# Patient Record
Sex: Female | Born: 1960 | Race: White | Hispanic: No | Smoking: Never smoker
Health system: Southern US, Community
[De-identification: ages and names within clinical notes are randomized; demographics above are authoritative.]

## PROBLEM LIST (undated history)

## (undated) DIAGNOSIS — J302 Other seasonal allergic rhinitis: Secondary | ICD-10-CM

## (undated) DIAGNOSIS — F419 Anxiety disorder, unspecified: Secondary | ICD-10-CM

## (undated) DIAGNOSIS — G43909 Migraine, unspecified, not intractable, without status migrainosus: Secondary | ICD-10-CM

## (undated) DIAGNOSIS — R112 Nausea with vomiting, unspecified: Secondary | ICD-10-CM

## (undated) DIAGNOSIS — Z9889 Other specified postprocedural states: Secondary | ICD-10-CM

## (undated) DIAGNOSIS — E079 Disorder of thyroid, unspecified: Secondary | ICD-10-CM

## (undated) DIAGNOSIS — E039 Hypothyroidism, unspecified: Secondary | ICD-10-CM

## (undated) HISTORY — PX: SPINE SURGERY: SHX786

## (undated) HISTORY — PX: CHOLECYSTECTOMY: SHX55

## (undated) HISTORY — DX: Disorder of thyroid, unspecified: E07.9

## (undated) HISTORY — PX: NASAL SINUS SURGERY: SHX719

## (undated) HISTORY — DX: Migraine, unspecified, not intractable, without status migrainosus: G43.909

---

## 1993-10-21 HISTORY — PX: ABDOMINAL HYSTERECTOMY: SHX81

## 2004-07-09 ENCOUNTER — Encounter: Payer: Self-pay | Admitting: Podiatry

## 2005-07-23 ENCOUNTER — Ambulatory Visit: Payer: Self-pay | Admitting: Surgery

## 2005-07-28 ENCOUNTER — Ambulatory Visit: Payer: Self-pay | Admitting: Surgery

## 2005-12-31 ENCOUNTER — Ambulatory Visit: Payer: Self-pay | Admitting: Unknown Physician Specialty

## 2006-01-02 ENCOUNTER — Ambulatory Visit: Payer: Self-pay | Admitting: Unknown Physician Specialty

## 2006-01-14 ENCOUNTER — Ambulatory Visit: Payer: Self-pay | Admitting: Unknown Physician Specialty

## 2006-01-17 ENCOUNTER — Ambulatory Visit: Payer: Self-pay | Admitting: Orthopaedic Surgery

## 2006-01-21 ENCOUNTER — Ambulatory Visit: Payer: Self-pay | Admitting: Otolaryngology

## 2006-02-19 ENCOUNTER — Ambulatory Visit: Payer: Self-pay | Admitting: Unknown Physician Specialty

## 2006-06-23 HISTORY — PX: SHOULDER ARTHROSCOPY W/ CAPSULAR REPAIR: SHX2398

## 2007-01-22 ENCOUNTER — Ambulatory Visit: Payer: Self-pay | Admitting: Unknown Physician Specialty

## 2007-04-14 ENCOUNTER — Ambulatory Visit: Payer: Self-pay | Admitting: Unknown Physician Specialty

## 2007-06-30 ENCOUNTER — Ambulatory Visit: Payer: Self-pay | Admitting: Gastroenterology

## 2008-03-02 ENCOUNTER — Ambulatory Visit: Payer: Self-pay | Admitting: Unknown Physician Specialty

## 2008-03-26 ENCOUNTER — Ambulatory Visit: Payer: Self-pay | Admitting: Family Medicine

## 2008-04-17 ENCOUNTER — Ambulatory Visit: Payer: Self-pay | Admitting: Unknown Physician Specialty

## 2008-06-06 ENCOUNTER — Ambulatory Visit: Payer: Self-pay | Admitting: Anesthesiology

## 2008-07-07 ENCOUNTER — Ambulatory Visit: Payer: Self-pay | Admitting: Anesthesiology

## 2008-09-01 ENCOUNTER — Ambulatory Visit: Payer: Self-pay | Admitting: Family Medicine

## 2008-11-20 ENCOUNTER — Ambulatory Visit: Payer: Self-pay | Admitting: Internal Medicine

## 2009-01-15 ENCOUNTER — Ambulatory Visit: Payer: Self-pay

## 2009-01-21 ENCOUNTER — Ambulatory Visit: Payer: Self-pay | Admitting: Internal Medicine

## 2009-04-10 ENCOUNTER — Ambulatory Visit: Payer: Self-pay | Admitting: Unknown Physician Specialty

## 2009-04-26 ENCOUNTER — Ambulatory Visit: Payer: Self-pay | Admitting: Unknown Physician Specialty

## 2009-05-02 ENCOUNTER — Encounter: Payer: Self-pay | Admitting: Unknown Physician Specialty

## 2009-05-23 ENCOUNTER — Encounter: Payer: Self-pay | Admitting: Unknown Physician Specialty

## 2009-05-30 ENCOUNTER — Ambulatory Visit: Payer: Self-pay | Admitting: Unknown Physician Specialty

## 2009-06-20 ENCOUNTER — Ambulatory Visit: Payer: Self-pay | Admitting: Specialist

## 2009-07-03 ENCOUNTER — Ambulatory Visit: Payer: Self-pay | Admitting: Internal Medicine

## 2009-07-04 ENCOUNTER — Encounter: Payer: Self-pay | Admitting: Specialist

## 2009-07-24 ENCOUNTER — Encounter: Payer: Self-pay | Admitting: Specialist

## 2009-07-31 LAB — HM COLONOSCOPY: HM Colonoscopy: NORMAL

## 2009-10-14 ENCOUNTER — Ambulatory Visit: Payer: Self-pay | Admitting: Family Medicine

## 2010-01-15 ENCOUNTER — Ambulatory Visit: Payer: Self-pay | Admitting: Internal Medicine

## 2010-04-02 ENCOUNTER — Ambulatory Visit: Payer: Self-pay

## 2011-01-20 ENCOUNTER — Ambulatory Visit: Payer: Self-pay | Admitting: Unknown Physician Specialty

## 2011-01-21 LAB — PATHOLOGY REPORT

## 2011-05-07 ENCOUNTER — Ambulatory Visit: Payer: Self-pay

## 2011-05-26 ENCOUNTER — Ambulatory Visit: Payer: Self-pay | Admitting: Unknown Physician Specialty

## 2011-05-26 LAB — HM MAMMOGRAPHY: HM Mammogram: NORMAL

## 2011-06-28 LAB — HM MAMMOGRAPHY: HM Mammogram: NORMAL

## 2011-07-24 ENCOUNTER — Ambulatory Visit: Payer: Self-pay | Admitting: Internal Medicine

## 2011-07-25 ENCOUNTER — Ambulatory Visit: Payer: Self-pay | Admitting: Internal Medicine

## 2011-07-28 ENCOUNTER — Ambulatory Visit: Payer: Self-pay | Admitting: Internal Medicine

## 2011-09-01 ENCOUNTER — Ambulatory Visit (INDEPENDENT_AMBULATORY_CARE_PROVIDER_SITE_OTHER): Payer: PRIVATE HEALTH INSURANCE | Admitting: Internal Medicine

## 2011-09-01 ENCOUNTER — Encounter: Payer: Self-pay | Admitting: Internal Medicine

## 2011-09-01 DIAGNOSIS — Z780 Asymptomatic menopausal state: Secondary | ICD-10-CM

## 2011-09-01 DIAGNOSIS — M5106 Intervertebral disc disorders with myelopathy, lumbar region: Secondary | ICD-10-CM | POA: Insufficient documentation

## 2011-09-01 DIAGNOSIS — E669 Obesity, unspecified: Secondary | ICD-10-CM | POA: Insufficient documentation

## 2011-09-01 DIAGNOSIS — K219 Gastro-esophageal reflux disease without esophagitis: Secondary | ICD-10-CM

## 2011-09-01 DIAGNOSIS — N951 Menopausal and female climacteric states: Secondary | ICD-10-CM

## 2011-09-01 DIAGNOSIS — E039 Hypothyroidism, unspecified: Secondary | ICD-10-CM | POA: Insufficient documentation

## 2011-09-01 DIAGNOSIS — G43909 Migraine, unspecified, not intractable, without status migrainosus: Secondary | ICD-10-CM

## 2011-09-01 MED ORDER — VENLAFAXINE HCL ER 75 MG PO CP24: 75.0000 mg | ORAL_CAPSULE | Freq: Every day | ORAL | Status: DC

## 2011-09-01 MED ORDER — RIZATRIPTAN BENZOATE 10 MG PO TABS: 10.0000 mg | ORAL_TABLET | ORAL | Status: DC | PRN

## 2011-09-01 MED ORDER — PANTOPRAZOLE SODIUM 40 MG PO TBEC: 40.0000 mg | DELAYED_RELEASE_TABLET | Freq: Two times a day (BID) | ORAL | Status: DC

## 2011-09-01 NOTE — Assessment & Plan Note (Signed)
Managed with Synthroid. TSH due.

## 2011-09-01 NOTE — Progress Notes (Signed)
  Subjective:    Patient ID: Natasha Pitts, female    DOB: October 01, 1960, 51 y.o.   MRN: [id number]  HPI  ICU RN presents for primary care,.  History of L4 laminectomy and S1 discectomy 2 weeks ago by [facility] in [place] for symptoms of back pain  l5 laminectomy last year by [facility] .  Still numbn in the S1 distribution (back of calf down achilles) .  On short term disability till mid April.    Past Medical History  Diagnosis Date  . Thyroid disease   . Migraines     improved with menopasue   No current outpatient prescriptions on file prior to visit.   Review of Systems  Constitutional: Negative for fever, chills and unexpected weight change.  HENT: Negative for hearing loss, ear pain, nosebleeds, congestion, sore throat, facial swelling, rhinorrhea, sneezing, mouth sores, trouble swallowing, neck pain, neck stiffness, voice change, postnasal drip, sinus pressure, tinnitus and ear discharge.   Eyes: Negative for pain, discharge, redness and visual disturbance.  Respiratory: Negative for cough, chest tightness, shortness of breath, wheezing and stridor.   Cardiovascular: Negative for chest pain, palpitations and leg swelling.  Musculoskeletal: Negative for myalgias and arthralgias.  Skin: Negative for color change and rash.  Neurological: Negative for dizziness, weakness, light-headedness and headaches.  Hematological: Negative for adenopathy.       Objective:   Physical Exam  Constitutional: She is oriented to person, place, and time. She appears well-developed and well-nourished.  HENT:  Mouth/Throat: Oropharynx is clear and moist.  Eyes: EOM are normal. Pupils are equal, round, and reactive to light. No scleral icterus.  Neck: Normal range of motion. Neck supple. No JVD present. No thyromegaly present.  Cardiovascular: Normal rate, regular rhythm, normal heart sounds and intact distal pulses.   Pulmonary/Chest: Effort normal and breath sounds normal.  Abdominal: Soft. Bowel sounds are  normal. She exhibits no mass. There is no tenderness.  Musculoskeletal: Normal range of motion. She exhibits no edema.  Lymphadenopathy:    She has no cervical adenopathy.  Neurological: She is alert and oriented to person, place, and time.  Skin: Skin is warm and dry.  Psychiatric: She has a normal mood and affect.          Assessment & Plan:

## 2011-09-01 NOTE — Assessment & Plan Note (Signed)
With recent lumbar spine surgery, she is not a candidate for exercise currently.  I have addressed  BMI and recommended a low glycemic index diet utilitzign smaller more frequent meals to aid his metabolism.  I have alse recommended that he start exercisign with a gaol of 30 minutes of aerovic exercise a minimum of 5 days per week.

## 2011-09-01 NOTE — Assessment & Plan Note (Signed)
S/p l4 and S1 laminectomy/decompression several weeks ago with excellent results.

## 2011-12-01 ENCOUNTER — Encounter: Payer: PRIVATE HEALTH INSURANCE | Admitting: Internal Medicine

## 2011-12-10 ENCOUNTER — Ambulatory Visit: Payer: Self-pay | Admitting: Bariatrics

## 2011-12-16 ENCOUNTER — Other Ambulatory Visit: Payer: Self-pay | Admitting: Internal Medicine

## 2011-12-22 ENCOUNTER — Ambulatory Visit: Payer: Self-pay | Admitting: Bariatrics

## 2012-03-24 ENCOUNTER — Ambulatory Visit (INDEPENDENT_AMBULATORY_CARE_PROVIDER_SITE_OTHER): Payer: PRIVATE HEALTH INSURANCE | Admitting: Internal Medicine

## 2012-03-24 ENCOUNTER — Encounter: Payer: Self-pay | Admitting: Internal Medicine

## 2012-03-24 VITALS — BP 132/80 | HR 73 | Temp 98.2°F | Ht 59.0 in | Wt 179.2 lb

## 2012-03-24 DIAGNOSIS — M79641 Pain in right hand: Secondary | ICD-10-CM

## 2012-03-24 DIAGNOSIS — E669 Obesity, unspecified: Secondary | ICD-10-CM

## 2012-03-24 DIAGNOSIS — M79609 Pain in unspecified limb: Secondary | ICD-10-CM

## 2012-03-24 DIAGNOSIS — M5106 Intervertebral disc disorders with myelopathy, lumbar region: Secondary | ICD-10-CM

## 2012-03-24 MED ORDER — OXYCODONE-ACETAMINOPHEN 5-325 MG PO TABS: 1.0000 | ORAL_TABLET | Freq: Four times a day (QID) | ORAL | Status: DC | PRN

## 2012-03-24 MED ORDER — RIZATRIPTAN BENZOATE 10 MG PO TABS: 10.0000 mg | ORAL_TABLET | ORAL | Status: DC | PRN

## 2012-03-24 NOTE — Patient Instructions (Addendum)
This is  Dr. Tullos's version of a  "Low GI"  Diet:  All of the foods can be found at grocery stores and in bulk at [employer].  The Atkins protein bars and shakes are available in more varieties at Target, WalMart and [employer].     7 AM Breakfast:  Low carbohydrate Protein  Shakes (I recommend the EAS AdvantEdge "Carb Control" shakes  Or the low carb shakes by Atkins.   Both are available everywhere:  In  cases at BJs  Or in 4 packs at grocery stores and pharmacies  2.5 carbs  (Alternative is  a toasted Arnold's Sandwhich Thin w/ peanut butter, a "Bagel Thin" with cream cheese and salmon) or  a scrambled egg burrito made with a low carb tortilla .  Avoid cereal and bananas, oatmeal too unless you are cooking the old fashioned kind that takes 30-40 minutes to prepare.  the rest is overly processed, has minimal fiber, and is loaded with carbohydrates!   10 AM: Protein bar by Atkins (the snack size, under 200 cal).  There are many varieties , available widely again or in bulk in limited varieties at BJs)  Other so called "protein bars" tend to be loaded with carbohydrates.  Remember, in food advertising, the word "energy" is synonymous for " carbohydrate."  Lunch: sandwich of turkey, (or any lunchmeat, grilled meat or canned tuna), fresh avocado, mayonnaise  and cheese on a lower carbohydrate pita bread, flatbread, or tortilla . Ok to use regular mayonnaise. The bread is the only source or carbohydrate that can be decreased (Joseph's makes a pita bread and a flat bread that are 50 cal and 4 net carbs ; Toufayan makes a low carb flatbread that's 100 cal and 9 net carbs  and  Mission makes a low carb whole wheat tortilla  That is 210 cal and 6 net carbs)  3 PM:  Mid day :  Another protein bar,  Or a  cheese stick (100 cal, 0 carbs),  Or 1 ounce of  almonds, walnuts, pistachios, pecans, peanuts,  Macadamia nuts. Or a Dannon light n Fit greek yogurt, 80 cal 8 net carbs . Avoid "granola"; the dried cranberries  and raisins are loaded with carbohydrates. Mixed nuts ok if no raisins or cranberries or dried fruit.      6 PM  Dinner:  "mean and green:"  Meat/chicken/fish or a high protein legume; , with a green salad, and a low GI  Veggie (broccoli, cauliflower, green beans, spinach, brussel sprouts. Lima beans) : Avoid "Low fat dressings, as well as [place] and [place]! They are loaded with sugar! Instead use ranch, vinagrette,  Blue cheese, etc  9 PM snack : Breyer's "low carb" fudgsicle or  ice cream bar (Carb Smart line), or  Weight Watcher's ice cream bar , or another "no sugar added" ice cream;a serving of fresh berries/cherries with whipped cream (Avoid bananas, pineapple, grapes  and watermelon on a regular basis because they are high in sugar)   Remember that snack Substitutions should be less than 15 to 20 carbs  Per serving. Remember to subtract fiber grams and sugar alcohols to get the "net carbs."    

## 2012-03-24 NOTE — Progress Notes (Signed)
Patient ID: Natasha Pitts, female   DOB: April 15, 1961, 51 y.o.   MRN: [id number]  Patient Active Problem List  Diagnosis  . Migraines  . Obesity (BMI 30-39.9)  . Lumbar disc disorder with myelopathy  . Hypothyroidism    Subjective:  CC:   Chief Complaint  Patient presents with  . Follow-up    HPI:   Natasha Pitts a 51 y.o. female who presents with back pain. 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 She has had ncreased pain  For the past 3 days secondary to her incresaed workload at ICU.  Has nerve damage, per neurosurgeon.  She has been taking neurontin. She has not been exercising and has not lost any weight.   Left hand going numb a lot,  Wakes up with it numb.  History of cervical disk disease by prior MRI with persistent neck pain partially relieved with massage. She also has history of carpal tunnel surgery on the right , done in 2000 by Deeann Saint.    Past Medical History  Diagnosis Date  . Thyroid disease   . Migraines     improved with menopasue    Past Surgical History  Procedure Date  . Spine surgery   . Abdominal hysterectomy May 1995    and left oophorectomy  . Shoulder arthroscopy w/ capsular repair 2008    Miller, with CTS          The following portions of the patient's history were reviewed and updated as appropriate: Allergies, current medications, and problem list.    Review of Systems:   12 Pt  review of systems was negative except those addressed in the HPI,     History   Social History  . Marital Status: Divorced    Spouse Name: N/A    Number of Children: N/A  . Years of Education: N/A   Occupational History  . Not on file.   Social History Main Topics  . Smoking status: Never Smoker   . Smokeless tobacco: Never Used  . Alcohol Use: No  . Drug Use: No  . Sexually Active: Not on file   Other Topics  Concern  . Not on file   Social History Narrative  . No narrative on file    Objective:  BP 132/80  Pulse 73  Temp 98.2 F (36.8 C) (Oral)  Ht 4\' 11"  (1.499 m)  Wt 179 lb 4 oz (81.307 kg)  BMI 36.20 kg/m2  SpO2 97%  General appearance: alert, cooperative and appears stated age Ears: normal TM's and external ear canals both ears Throat: lips, mucosa, and tongue normal; teeth and gums normal Neck: no adenopathy, no carotid bruit, supple, symmetrical, trachea midline and thyroid not enlarged, symmetric, no tenderness/mass/nodules Back: symmetric, no curvature. ROM normal. No CVA tenderness. Lungs: clear to auscultation bilaterally Heart: regular rate and rhythm, S1, S2 normal, no murmur, click, rub or gallop Abdomen: soft, non-tender; bowel sounds normal; no masses,  no organomegaly Pulses: 2+ and symmetric Skin: Skin color, texture, turgor normal. No rashes or lesions Lymph nodes: Cervical, supraclavicular, and axillary nodes normal. Neuro: normal DTRs, strength, no muscle wasting  Assessment and Plan: Bilateral hand pain Her hand pain and exam suggests CTS.  She has wrist splints from prior treatment and does not want   further workup at this time  Lumbar disc disorder with myelopathy recommend  weight loss  Back strengthening exercises  Obesity (BMI 30-39.9) I have addressed  BMI and recommended a low glycemic index diet utilizing smaller more frequent meals to increase metabolism.  I have also recommended that patient start exercising with a goal of 30 minutes of aerobic exercise a minimum of 5 days per week. Screening for lipid disorders, thyroid and diabetes to be done today.     Updated Medication List Outpatient Encounter Prescriptions as of 03/24/2012  Medication Sig Dispense Refill  . estradiol (ESTRACE) 1 MG tablet Take 1 mg by mouth daily.      Marland Kitchen gabapentin (NEURONTIN) 300 MG capsule Take 300 mg by mouth 3 (three) times daily.      Marland Kitchen levothyroxine (SYNTHROID,  LEVOTHROID) 100 MCG tablet Take 100 mcg by mouth daily.      . methocarbamol (ROBAXIN) 750 MG tablet Take 750 mg by mouth 3 (three) times daily.      Marland Kitchen oxyCODONE-acetaminophen (PERCOCET/ROXICET) 5-325 MG per tablet Take 1 tablet by mouth every 6 (six) hours as needed.  60 tablet  0  . pantoprazole (PROTONIX) 40 MG tablet Take 1 tablet (40 mg total) by mouth 2 (two) times daily.  60 tablet  11  . rizatriptan (MAXALT) 10 MG tablet Take 1 tablet (10 mg total) by mouth as needed for migraine. May repeat in 2 hours if needed  10 tablet  11  . venlafaxine (EFFEXOR-XR) 75 MG 24 hr capsule Take 1 capsule (75 mg total) by mouth daily.  30 capsule  11  . DISCONTD: oxyCODONE-acetaminophen (PERCOCET) 5-325 MG per tablet Take 1 tablet by mouth every 4 (four) hours as needed.      Marland Kitchen DISCONTD: rizatriptan (MAXALT) 10 MG tablet Take 1 tablet (10 mg total) by mouth as needed for migraine. May repeat in 2 hours if needed  10 tablet  0

## 2012-03-25 ENCOUNTER — Encounter: Payer: Self-pay | Admitting: Internal Medicine

## 2012-03-25 DIAGNOSIS — M79641 Pain in right hand: Secondary | ICD-10-CM | POA: Insufficient documentation

## 2012-03-25 DIAGNOSIS — M79642 Pain in left hand: Secondary | ICD-10-CM | POA: Insufficient documentation

## 2012-03-25 NOTE — Assessment & Plan Note (Signed)
recommend weight loss  Back strengthening exercises

## 2012-03-25 NOTE — Assessment & Plan Note (Signed)
I have addressed  BMI and recommended a low glycemic index diet utilizing smaller more frequent meals to increase metabolism.  I have also recommended that patient start exercising with a goal of 30 minutes of aerobic exercise a minimum of 5 days per week. Screening for lipid disorders, thyroid and diabetes to be done today.   

## 2012-03-25 NOTE — Assessment & Plan Note (Signed)
Her hand pain and exam suggests CTS.  She has wrist splints from prior treatment and does not want   further workup at this time

## 2012-05-16 ENCOUNTER — Ambulatory Visit: Payer: Self-pay

## 2012-06-24 ENCOUNTER — Ambulatory Visit: Payer: Self-pay | Admitting: Internal Medicine

## 2012-07-12 ENCOUNTER — Other Ambulatory Visit: Payer: Self-pay | Admitting: Internal Medicine

## 2012-07-12 MED ORDER — LEVOTHYROXINE SODIUM 100 MCG PO TABS: 100.0000 ug | ORAL_TABLET | Freq: Every day | ORAL | Status: DC

## 2012-07-12 NOTE — Telephone Encounter (Signed)
Med filled.  

## 2012-07-12 NOTE — Telephone Encounter (Signed)
levothyroxine (SYNTHROID, LEVOTHROID) 100 MCG tablet  #90

## 2012-07-13 ENCOUNTER — Encounter: Payer: Self-pay | Admitting: Internal Medicine

## 2012-07-28 ENCOUNTER — Ambulatory Visit (INDEPENDENT_AMBULATORY_CARE_PROVIDER_SITE_OTHER): Payer: 59 | Admitting: Internal Medicine

## 2012-07-28 ENCOUNTER — Encounter: Payer: Self-pay | Admitting: Internal Medicine

## 2012-07-28 VITALS — BP 130/82 | HR 78 | Temp 97.8°F | Resp 16 | Ht 59.75 in | Wt 179.0 lb

## 2012-07-28 DIAGNOSIS — M79609 Pain in unspecified limb: Secondary | ICD-10-CM

## 2012-07-28 DIAGNOSIS — E785 Hyperlipidemia, unspecified: Secondary | ICD-10-CM

## 2012-07-28 DIAGNOSIS — M79641 Pain in right hand: Secondary | ICD-10-CM

## 2012-07-28 DIAGNOSIS — E669 Obesity, unspecified: Secondary | ICD-10-CM

## 2012-07-28 DIAGNOSIS — M5106 Intervertebral disc disorders with myelopathy, lumbar region: Secondary | ICD-10-CM

## 2012-07-28 DIAGNOSIS — R5383 Other fatigue: Secondary | ICD-10-CM

## 2012-07-28 DIAGNOSIS — Z0000 Encounter for general adult medical examination without abnormal findings: Secondary | ICD-10-CM

## 2012-07-28 NOTE — Patient Instructions (Addendum)
You can lose 10%  Of your current body weight over the next 6 months   This is  My updated   "Low GI"  Diet:  It is not ultra low carb, but will still lower your blood sugars and allow you to lose 5 to 10 lbs per month if you follow it carefully. All of the foods can be found at grocery stores and in bulk at [employer].  The Atkins protein bars and shakes are available in more varieties at Target, WalMart and [employer].     7 AM Breakfast:  Low carbohydrate Protein  Shakes (I recommend the EAS AdvantEdge "Carb Control" shakes  Or the low carb shakes by Atkins.   Both are available everywhere:  In  cases at BJs  Or in 4 packs at grocery stores and pharmacies  2.5 carbs  (Alternative is  a toasted Arnold's Sandwhich Thin w/ peanut butter, a "Bagel Thin" with cream cheese and salmon) or  a scrambled egg burrito made with a low carb tortilla .  Avoid cereal and bananas, oatmeal too unless you are cooking the old fashioned kind that takes 30-40 minutes to prepare.  the rest is overly processed, has minimal fiber, and is loaded with carbohydrates!   10 AM: Protein bar by Atkins (the snack size, under 200 cal).  There are many varieties , available widely again or in bulk in limited varieties at BJs)  Other so called "protein bars" tend to be loaded with carbohydrates.  Remember, in food advertising, the word "energy" is synonymous for " carbohydrate."  Lunch: sandwich of [place], (or any lunchmeat, grilled meat or canned tuna), fresh avocado, mayonnaise  and cheese on a lower carbohydrate pita bread, flatbread, or tortilla . Ok to use regular mayonnaise. The bread is the only source or carbohydrate that can be decreased (Joseph's makes a pita bread and a flat bread that are 50 cal and 4 net carbs ; Toufayan makes a low carb flatbread that's 100 cal and 9 net carbs  and  Mission makes a low carb whole wheat tortilla  That is 210 cal and 6 net carbs)  3 PM:  Mid day :  Another protein bar,  Or a  cheese stick  (100 cal, 0 carbs),  Or 1 ounce of  almonds, walnuts, pistachios, pecans, peanuts,  Macadamia nuts. Or a Dannon light n Fit greek yogurt, 80 cal 8 net carbs . Avoid "granola"; the dried cranberries and raisins are loaded with carbohydrates. Mixed nuts ok if no raisins or cranberries or dried fruit.      6 PM  Dinner:  "mean and green:"  Meat/chicken/fish or a high protein legume; , with a green salad, and a low GI  Veggie (broccoli, cauliflower, green beans, spinach, brussel sprouts. Lima beans) : Avoid "Low fat dressings, as well as Reyne Dumas and [address]! They are loaded with sugar! Instead use ranch, vinagrette,  Blue cheese, etc.  There is a low carb pasta by Dreamfield's available at [facility] that is acceptable and tastes great. Try Michel Angel's chicken piccata over low carb pasta. The chicken dish is 0 carbs, and can be found in frozen section at BJs and [employer]. Also try [employer]" (pulled pork, no sauce,  0 carbs) and his pot roast.   both are in the refrigerated section at BJs   Dreamfield's makes a low carb pasta only 5 g/serving.  Available at all grocery stores,  And tastes like normal pasta  9 PM snack : Breyer's "low carb" fudgsicle or  ice cream bar (Carb Smart line), or  Weight Watcher's ice cream bar , or another "no sugar added" ice cream;a serving of fresh berries/cherries with whipped cream (Avoid bananas, pineapple, grapes  and watermelon on a regular basis because they are high in sugar)   Remember that snack Substitutions should be less than 10 carbs per serving and meals < 20 carbs. Remember to subtract fiber grams and sugar alcohols to get the "net carbs."

## 2012-07-28 NOTE — Progress Notes (Signed)
Patient ID: Natasha Pitts, female   DOB: [birth year], 52 y.o.   MRN: [id number]   Subjective:     Natasha Pitts is a 52 y.o. female and is here for a comprehensive physical exam. The patient reports 1) obesity . She has been unable to lose weight,  wants to consider gastric  banding.  2) Has history of asthma controlled with Advair. Has been noting some dyspnea with exertion lately.  NO wheezing or chest/jaw pain .    3) Back pain: she is taking mobic daily and neurontin twice daily  For left sided sciatica pain which is still present despite surgery .4) arthritis :  She has developed heberdene's nodes on several fingers on both hands several fingers on both hands. 5) family history of postmenopausal  osteoporosis :  she has been taking hormone replacement therapy for years since her TAH/BSO. She has no history of fractures and no other risk factors..   History   Social History  . Marital Status: Divorced    Spouse Name: N/A    Number of Children: N/A  . Years of Education: N/A   Occupational History  . Not on file.   Social History Main Topics  . Smoking status: Never Smoker   . Smokeless tobacco: Never Used  . Alcohol Use: No  . Drug Use: No  . Sexually Active: Not on file   Other Topics Concern  . Not on file   Social History Narrative  . No narrative on file   Health Maintenance  Topic Date Due  . Influenza Vaccine  02/21/2013  . Pap Smear  12/01/2013  . Mammogram  06/24/2014  . Tetanus/tdap  07/28/2017  . Colonoscopy  01/19/2021    The following portions of the patient's history were reviewed and updated as appropriate: allergies, current medications, past family history, past medical history, past social history, past surgical history and problem list.  Review of Systems A comprehensive review of systems was negative.   Objective:        Assessment:    Healthy female exam. BP 130/82  Pulse 78  Temp 97.8 F (36.6 C) (Oral)  Resp 16  Ht 4' 11.75" (1.518 m)  Wt 179 lb  (81.194 kg)  BMI 35.25 kg/m2  SpO2 98%  General Appearance:    Alert, cooperative, no distress, appears stated age  Head:    Normocephalic, without obvious abnormality, atraumatic  Eyes:    PERRL, conjunctiva/corneas clear, EOM's intact, fundi    benign, both eyes  Ears:    Normal TM's and external ear canals, both ears  Nose:   Nares normal, septum midline, mucosa normal, no drainage    or sinus tenderness  Throat:   Lips, mucosa, and tongue normal; teeth and gums normal  Neck:   Supple, symmetrical, trachea midline, no adenopathy;    thyroid:  no enlargement/tenderness/nodules; no carotid   bruit or JVD  Back:     Symmetric, no curvature, ROM normal, no CVA tenderness  Lungs:     Clear to auscultation bilaterally, respirations unlabored  Chest Wall:    No tenderness or deformity   Heart:    Regular rate and rhythm, S1 and S2 normal, no murmur, rub   or gallop  Breast Exam:    No tenderness, masses, or nipple abnormality  Abdomen:     Soft, non-tender, bowel sounds active all four quadrants,    no masses, no organomegaly        Extremities:   Extremities normal, atraumatic,  no cyanosis or edema  Pulses:   2+ and symmetric all extremities  Skin:   Skin color, texture, turgor normal, no rashes or lesions  Lymph nodes:   Cervical, supraclavicular, and axillary nodes normal  Neurologic:   CNII-XII intact, normal strength, sensation and reflexes    throughout       Plan:     Obesity (BMI 30-39.9) Her BMI remains unchanged despite exercise. We discussed the nature and quality of her exercises well as of her diet. Usually what I find is that people are not exercising as vigorously as they should to achieve a sustained heart rate in the aerobic zone. I suggested that she consider joining a gym and using a personal trainer to help guide her efforts.   I also am advising her to get back on the low GI diet using six smaller meals a day to stimulate her metabolism.    Lumbar disc  disorder with myelopathy Persistent despite lumbar surgery in 2013. Recommended weight loss, Pilates and other core strengthening exercises as well as continue use of Neurontin and nonsteroidal anti-inflammatory. Recommended the use of a proton pump inhibitor for prevention of gastric ulcers.  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  Routine general medical examination at a health care facility Annual comprehensive exam was done including breast, pelvic exam. All screenings have been addressed .     Updated Medication List Outpatient Encounter Prescriptions as of 07/28/2012  Medication Sig Dispense Refill  . estradiol (ESTRACE) 1 MG tablet Take 1 mg by mouth daily.      Marland Kitchen gabapentin (NEURONTIN) 300 MG capsule Take 300 mg by mouth 3 (three) times daily.      Marland Kitchen levothyroxine (SYNTHROID, LEVOTHROID) 100 MCG tablet Take 1 tablet (100 mcg total) by mouth daily.  30 tablet  3  . meloxicam (MOBIC) 15 MG tablet Take 15 mg by mouth daily.      . methocarbamol (ROBAXIN) 750 MG tablet Take 750 mg by mouth 3 (three) times daily.      . pantoprazole (PROTONIX) 40 MG tablet Take 1 tablet (40 mg total) by mouth 2 (two) times daily.  60 tablet  11  . rizatriptan (MAXALT) 10 MG tablet Take 1 tablet (10 mg total) by mouth as needed for migraine. May repeat in 2 hours if needed  10 tablet  11  . venlafaxine (EFFEXOR-XR) 75 MG 24 hr capsule Take 1 capsule (75 mg total) by mouth daily.  30 capsule  11  . [DISCONTINUED] oxyCODONE-acetaminophen (PERCOCET/ROXICET) 5-325 MG per tablet Take 1 tablet by mouth every 6 (six) hours as needed.  60 tablet  0

## 2012-07-29 DIAGNOSIS — Z0000 Encounter for general adult medical examination without abnormal findings: Secondary | ICD-10-CM | POA: Insufficient documentation

## 2012-07-29 NOTE — Assessment & Plan Note (Signed)
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

## 2012-07-29 NOTE — Assessment & Plan Note (Signed)
Persistent despite lumbar surgery in 2013. Recommended weight loss, Pilates and other core strengthening exercises as well as continue use of Neurontin and nonsteroidal anti-inflammatory. Recommended the use of a proton pump inhibitor for prevention of gastric ulcers.

## 2012-07-29 NOTE — Assessment & Plan Note (Signed)
Annual comprehensive exam was done including breast, pelvic exam. All screenings have been addressed .  

## 2012-07-29 NOTE — Assessment & Plan Note (Signed)
Her BMI remains unchanged despite exercise. We discussed the nature and quality of her exercises well as of her diet. Usually what I find is that people are not exercising as vigorously as they should to achieve a sustained heart rate in the aerobic zone. I suggested that she consider joining a gym and using a personal trainer to help guide her efforts.   I also am advising her to get back on the low GI diet using six smaller meals a day to stimulate her metabolism.   

## 2012-08-06 ENCOUNTER — Other Ambulatory Visit: Payer: 59

## 2012-08-10 ENCOUNTER — Other Ambulatory Visit: Payer: Self-pay | Admitting: *Deleted

## 2012-08-12 MED ORDER — ESTRADIOL 1 MG PO TABS: 1.0000 mg | ORAL_TABLET | Freq: Every day | ORAL | Status: DC

## 2012-08-12 NOTE — Telephone Encounter (Signed)
Med filled.  

## 2012-08-17 ENCOUNTER — Telehealth: Payer: Self-pay | Admitting: *Deleted

## 2012-08-17 MED ORDER — ALBUTEROL SULFATE HFA 108 (90 BASE) MCG/ACT IN AERS: 2.0000 | INHALATION_SPRAY | Freq: Four times a day (QID) | RESPIRATORY_TRACT | Status: DC | PRN

## 2012-08-17 NOTE — Telephone Encounter (Signed)
Med filled.  

## 2012-08-17 NOTE — Telephone Encounter (Signed)
Refill Request  Proair HFA 90 MCG inhaler  #8.50 GM  Inhale 2 puffs four times a day as needed

## 2012-08-20 ENCOUNTER — Other Ambulatory Visit (INDEPENDENT_AMBULATORY_CARE_PROVIDER_SITE_OTHER): Payer: 59

## 2012-08-20 DIAGNOSIS — R5383 Other fatigue: Secondary | ICD-10-CM

## 2012-08-20 DIAGNOSIS — R5381 Other malaise: Secondary | ICD-10-CM

## 2012-08-20 DIAGNOSIS — E785 Hyperlipidemia, unspecified: Secondary | ICD-10-CM

## 2012-08-20 LAB — CBC WITH DIFFERENTIAL/PLATELET
Basophils Absolute: 0.1 10*3/uL (ref 0.0–0.1)
Eosinophils Relative: 5.9 % — ABNORMAL HIGH (ref 0.0–5.0)
HCT: 39.1 % (ref 36.0–46.0)
Lymphocytes Relative: 32.4 % (ref 12.0–46.0)
Lymphs Abs: 1.8 10*3/uL (ref 0.7–4.0)
Monocytes Relative: 5 % (ref 3.0–12.0)
Platelets: 316 10*3/uL (ref 150.0–400.0)
WBC: 5.7 10*3/uL (ref 4.5–10.5)

## 2012-08-20 LAB — LIPID PANEL
Cholesterol: 215 mg/dL — ABNORMAL HIGH (ref 0–200)
HDL: 47.3 mg/dL (ref >39.00)
Triglycerides: 89 mg/dL (ref 0.0–149.0)
VLDL: 17.8 mg/dL (ref 0.0–40.0)

## 2012-08-20 LAB — TSH: TSH: 2.33 u[IU]/mL (ref 0.35–5.50)

## 2012-10-14 ENCOUNTER — Ambulatory Visit: Payer: Self-pay

## 2012-10-14 ENCOUNTER — Other Ambulatory Visit: Payer: Self-pay | Admitting: *Deleted

## 2012-10-14 DIAGNOSIS — Z780 Asymptomatic menopausal state: Secondary | ICD-10-CM

## 2012-10-14 MED ORDER — VENLAFAXINE HCL ER 75 MG PO CP24: 75.0000 mg | ORAL_CAPSULE | Freq: Every day | ORAL | Status: DC

## 2012-10-14 NOTE — Telephone Encounter (Signed)
Rx sent to pharmacy by escript  

## 2012-10-29 ENCOUNTER — Other Ambulatory Visit: Payer: Self-pay | Admitting: *Deleted

## 2012-10-29 DIAGNOSIS — K219 Gastro-esophageal reflux disease without esophagitis: Secondary | ICD-10-CM

## 2012-10-29 MED ORDER — PANTOPRAZOLE SODIUM 40 MG PO TBEC: 40.0000 mg | DELAYED_RELEASE_TABLET | Freq: Two times a day (BID) | ORAL | Status: DC

## 2012-10-29 NOTE — Telephone Encounter (Signed)
Rx sent to pharmacy by escript  

## 2012-11-16 ENCOUNTER — Other Ambulatory Visit: Payer: Self-pay | Admitting: *Deleted

## 2012-11-16 MED ORDER — LEVOTHYROXINE SODIUM 100 MCG PO TABS: 100.0000 ug | ORAL_TABLET | Freq: Every day | ORAL | Status: DC

## 2013-01-10 ENCOUNTER — Other Ambulatory Visit: Payer: Self-pay | Admitting: *Deleted

## 2013-01-10 DIAGNOSIS — Z780 Asymptomatic menopausal state: Secondary | ICD-10-CM

## 2013-01-10 MED ORDER — VENLAFAXINE HCL ER 75 MG PO CP24: 75.0000 mg | ORAL_CAPSULE | Freq: Every day | ORAL | Status: DC

## 2013-01-17 ENCOUNTER — Other Ambulatory Visit: Payer: Self-pay | Admitting: *Deleted

## 2013-01-18 NOTE — Telephone Encounter (Signed)
Appt 01/26/13

## 2013-01-20 ENCOUNTER — Telehealth: Payer: Self-pay | Admitting: *Deleted

## 2013-01-20 MED ORDER — METHOCARBAMOL 750 MG PO TABS: 750.0000 mg | ORAL_TABLET | Freq: Three times a day (TID) | ORAL | Status: DC

## 2013-01-20 NOTE — Telephone Encounter (Signed)
Pharmacy called and wanted to know if the script for the Robaxin suppose to be for a thirty day supply way it is written will last ten days.

## 2013-01-21 NOTE — Telephone Encounter (Signed)
Most people don't take a muscle relaxer 3 times daily. 30 pills normally lasts a month .  If she is taking it 3 times daily let me know

## 2013-01-24 NOTE — Telephone Encounter (Signed)
Pharmacy notified.

## 2013-01-26 ENCOUNTER — Ambulatory Visit (INDEPENDENT_AMBULATORY_CARE_PROVIDER_SITE_OTHER): Payer: 59 | Admitting: Internal Medicine

## 2013-01-26 ENCOUNTER — Encounter: Payer: Self-pay | Admitting: Internal Medicine

## 2013-01-26 VITALS — BP 118/70 | HR 90 | Temp 98.1°F | Resp 14 | Wt 181.2 lb

## 2013-01-26 DIAGNOSIS — R5383 Other fatigue: Secondary | ICD-10-CM

## 2013-01-26 DIAGNOSIS — Z780 Asymptomatic menopausal state: Secondary | ICD-10-CM

## 2013-01-26 DIAGNOSIS — R0789 Other chest pain: Secondary | ICD-10-CM

## 2013-01-26 DIAGNOSIS — E039 Hypothyroidism, unspecified: Secondary | ICD-10-CM

## 2013-01-26 DIAGNOSIS — E669 Obesity, unspecified: Secondary | ICD-10-CM

## 2013-01-26 DIAGNOSIS — M5106 Intervertebral disc disorders with myelopathy, lumbar region: Secondary | ICD-10-CM

## 2013-01-26 DIAGNOSIS — G609 Hereditary and idiopathic neuropathy, unspecified: Secondary | ICD-10-CM

## 2013-01-26 DIAGNOSIS — N951 Menopausal and female climacteric states: Secondary | ICD-10-CM

## 2013-01-26 LAB — COMPREHENSIVE METABOLIC PANEL
ALT: 22 U/L (ref 0–35)
Alkaline Phosphatase: 67 U/L (ref 39–117)
Creatinine, Ser: 0.8 mg/dL (ref 0.4–1.2)
Glucose, Bld: 121 mg/dL — ABNORMAL HIGH (ref 70–99)
Sodium: 139 mEq/L (ref 135–145)
Total Bilirubin: 0.5 mg/dL (ref 0.3–1.2)
Total Protein: 6.8 g/dL (ref 6.0–8.3)

## 2013-01-26 LAB — VITAMIN B12: Vitamin B-12: 538 pg/mL (ref 211–911)

## 2013-01-26 LAB — TSH: TSH: 8.6 u[IU]/mL — ABNORMAL HIGH (ref 0.35–5.50)

## 2013-01-26 MED ORDER — VENLAFAXINE HCL ER 75 MG PO CP24: 75.0000 mg | ORAL_CAPSULE | Freq: Every day | ORAL | Status: DC

## 2013-01-26 MED ORDER — ESTRADIOL 1 MG PO TABS: 1.0000 mg | ORAL_TABLET | Freq: Every day | ORAL | Status: DC

## 2013-01-26 MED ORDER — LEVOTHYROXINE SODIUM 100 MCG PO TABS: 100.0000 ug | ORAL_TABLET | Freq: Every day | ORAL | Status: DC

## 2013-01-26 MED ORDER — OXYCODONE-ACETAMINOPHEN 5-325 MG PO TABS: 1.0000 | ORAL_TABLET | Freq: Every evening | ORAL | Status: DC | PRN

## 2013-01-26 MED ORDER — METHOCARBAMOL 750 MG PO TABS: 750.0000 mg | ORAL_TABLET | Freq: Three times a day (TID) | ORAL | Status: DC

## 2013-01-26 NOTE — Assessment & Plan Note (Signed)
Relieved with MDI.  Needs PFTS.  Followed by referral by [facility] for interpretation.

## 2013-01-26 NOTE — Assessment & Plan Note (Addendum)
Now with right foot numbness 24/7 , leg cramping and low back pain .  Wants to see her neurosurgeon again in [place]. Has treid to call [facility] 3 times no anxswer. Will initiate referral,  Refill percocet, methocarbamol, etc.

## 2013-01-26 NOTE — Progress Notes (Signed)
Patient ID: Natasha Pitts, female   DOB: 10-Nov-1960, 52 y.o.   MRN: [id number]    Patient Active Problem List   Diagnosis Date Noted  . Routine general medical examination at a health care facility 07/29/2012  . Bilateral hand pain 03/25/2012  . Obesity (BMI 30-39.9) 09/01/2011  . Lumbar disc disorder with myelopathy 09/01/2011  . Hypothyroidism 09/01/2011  . Migraines     Subjective:  CC:   Chief Complaint  Patient presents with  . Follow-up    discuss medications    HPI:   Natasha Pitts a 52 y.o. female who presents for Follow up on multiple issues.  1) she needs Med refills   2) she has recurrent chest tightness, and a history of RAD without ever having had PFTS.  Has an albuterol MDI for emergencies,. But wonders if she needs to continue using dvair.   no prior PFTS   No history of tobacco use  3) radiculopathy from lumbar DDD/  Her Left leg and bottom of foot has been  numb since surgery in August  2013 at [facility], Dr Samuella Cota. )Now her right foot is going numb ande having righst sided back pain  Using percocet and gabapentin at night, nothing during the day except muscle relaxer and mobic and PPI   1) obesity Has been unable to lose weight,  wants to consider gastric  banding.  2) Has history of asthma controlled with Advair. Has been noting some dyspnea with exertion lately.  NO wheezing or chest/jaw pain .    3) Back pain: she is taking mobic daily and neurontin twice daily  For left sided sciatica pain which is still present despite surgery .4) arthritis :  She has developed heberdene's nodes on several fingers on both hands several fingers on both hands. 5) family history of postmenopausal  osteoporosis :  she has been taking hormone replacement therapy for years since her TAH/BSO. She has no history of fractures and no other risk factors..   Past Medical History  Diagnosis Date  . Thyroid disease   . Migraines     improved with menopasue     Past Surgical History  Procedure Laterality Date  . Spine surgery    . Abdominal hysterectomy  May 1995    and left oophorectomy  . Shoulder arthroscopy w/ capsular repair  2008    [facility], with CTS        The following portions of the patient's history were reviewed and updated as appropriate: Allergies, current medications, and problem list.    Review of Systems:   12 Pt  review of systems was negative except those addressed in the HPI,     History   Social History  . Marital Status: Divorced    Spouse Name: N/A    Number of Children: N/A  . Years of Education: N/A   Occupational History  . Not on file.   Social History Main Topics  . Smoking status: Never Smoker   . Smokeless tobacco: Never Used  . Alcohol Use: No  . Drug Use: No  . Sexually Active: Not on file   Other Topics Concern  . Not on file   Social History Narrative  . No narrative on file    Objective:  BP 118/70  Pulse 90  Temp(Src) 98.1 F (36.7 C) (Oral)  Resp 14  Wt 181 lb 4 oz (82.214 kg)  BMI 35.68 kg/m2  SpO2 98%  General appearance: alert, cooperative  and appears stated age Ears: normal TM's and external ear canals both ears Throat: lips, mucosa, and tongue normal; teeth and gums normal Neck: no adenopathy, no carotid bruit, supple, symmetrical, trachea midline and thyroid not enlarged, symmetric, no tenderness/mass/nodules Back: symmetric, no curvature. ROM normal. No CVA tenderness. Lungs: clear to auscultation bilaterally Heart: regular rate and rhythm, S1, S2 normal, no murmur, click, rub or gallop Abdomen: soft, non-tender; bowel sounds normal; no masses,  no organomegaly Pulses: 2+ and symmetric Skin: Skin color, texture, turgor normal. No rashes or lesions Lymph nodes: Cervical, supraclavicular, and axillary nodes normal. Neuro:  Decreased sensation bilaterally on the soles .  Strenth: dorsiflexion  4+/5 On  Right only.  .   ACHILLES REFLEX DOWN BUT NO upward  return  Assessment and Plan:  Lumbar disc disorder with myelopathy Now with right foot numbness 24/7 , leg cramping and low back pain .  Wants to see her neurosurgeon again in [place]. Has treid to call [facility] 3 times no anxswer. Will initiate referral,  Refill percocet, methocarbamol, etc.   Hypothyroidism Repeat tsh given altered reflexes  Obesity (BMI 30-39.9) I have addressed  BMI and recommended a low glycemic index diet utilizing smaller more frequent meals to increase metabolism.  I have also recommended that patient start exercising with a goal of 30 minutes of aerobic exercise a minimum of 5 days per week. Screening for lipid disorders, thyroid and diabetes to be done today.    Chest tightness Relieved with MDI.  Needs PFTS.  Followed by referral by [facility] for interpretation.    Updated Medication List Outpatient Encounter Prescriptions as of 01/26/2013  Medication Sig Dispense Refill  . albuterol (PROVENTIL HFA;VENTOLIN HFA) 108 (90 BASE) MCG/ACT inhaler Inhale 2 puffs into the lungs every 6 (six) hours as needed for wheezing.  1 Inhaler  2  . estradiol (ESTRACE) 1 MG tablet Take 1 tablet (1 mg total) by mouth daily.  90 tablet  3  . gabapentin (NEURONTIN) 300 MG capsule Take 300 mg by mouth 3 (three) times daily.      Marland Kitchen levothyroxine (SYNTHROID, LEVOTHROID) 100 MCG tablet Take 1 tablet (100 mcg total) by mouth daily.  90 tablet  3  . meloxicam (MOBIC) 15 MG tablet Take 15 mg by mouth daily.      . methocarbamol (ROBAXIN) 750 MG tablet Take 1 tablet (750 mg total) by mouth 3 (three) times daily.  90 tablet  6  . pantoprazole (PROTONIX) 40 MG tablet Take 1 tablet (40 mg total) by mouth 2 (two) times daily.  60 tablet  5  . rizatriptan (MAXALT) 10 MG tablet Take 1 tablet (10 mg total) by mouth as needed for migraine. May repeat in 2 hours if needed  10 tablet  11  . venlafaxine XR (EFFEXOR-XR) 75 MG 24 hr capsule Take 1 capsule (75 mg total) by mouth daily.  90 capsule  2  .  [DISCONTINUED] estradiol (ESTRACE) 1 MG tablet Take 1 tablet (1 mg total) by mouth daily.  30 tablet  6  . [DISCONTINUED] levothyroxine (SYNTHROID, LEVOTHROID) 100 MCG tablet Take 1 tablet (100 mcg total) by mouth daily.  30 tablet  6  . [DISCONTINUED] methocarbamol (ROBAXIN) 750 MG tablet Take 1 tablet (750 mg total) by mouth 3 (three) times daily.  30 tablet  2  . [DISCONTINUED] venlafaxine XR (EFFEXOR-XR) 75 MG 24 hr capsule Take 1 capsule (75 mg total) by mouth daily.  30 capsule  2  . oxyCODONE-acetaminophen (ROXICET) 5-325 MG  per tablet Take 1 tablet by mouth at bedtime as needed and may repeat dose one time if needed for pain.  60 tablet  0   No facility-administered encounter medications on file as of 01/26/2013.

## 2013-01-26 NOTE — Assessment & Plan Note (Signed)
Repeat tsh given altered reflexes

## 2013-01-26 NOTE — Patient Instructions (Addendum)
We will refer you to your neurosurgeon regarding your persistent right foot numbness  PFTs and pulmonary referral for RAD   Thyroid and  b12 today

## 2013-01-26 NOTE — Assessment & Plan Note (Signed)
I have addressed  BMI and recommended a low glycemic index diet utilizing smaller more frequent meals to increase metabolism.  I have also recommended that patient start exercising with a goal of 30 minutes of aerobic exercise a minimum of 5 days per week. Screening for lipid disorders, thyroid and diabetes to be done today.   

## 2013-01-27 MED ORDER — LEVOTHYROXINE SODIUM 112 MCG PO TABS: 112.0000 ug | ORAL_TABLET | Freq: Every day | ORAL | Status: DC

## 2013-01-27 NOTE — Addendum Note (Signed)
Addended by: Sherlene Shams on: 01/27/2013 11:39 PM   Modules accepted: Orders

## 2013-03-01 ENCOUNTER — Institutional Professional Consult (permissible substitution): Payer: 59 | Admitting: Pulmonary Disease

## 2013-04-18 ENCOUNTER — Telehealth: Payer: Self-pay | Admitting: Internal Medicine

## 2013-04-18 ENCOUNTER — Other Ambulatory Visit: Payer: Self-pay | Admitting: Internal Medicine

## 2013-04-18 MED ORDER — OXYCODONE-ACETAMINOPHEN 5-325 MG PO TABS: 1.0000 | ORAL_TABLET | Freq: Every evening | ORAL | Status: DC | PRN

## 2013-04-18 NOTE — Telephone Encounter (Signed)
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

## 2013-04-18 NOTE — Telephone Encounter (Signed)
Refill on Percocet 60 mg

## 2013-04-18 NOTE — Telephone Encounter (Signed)
Last OV 8/14 ok to fill ?

## 2013-05-09 ENCOUNTER — Encounter: Payer: Self-pay | Admitting: Internal Medicine

## 2013-05-25 ENCOUNTER — Telehealth: Payer: Self-pay | Admitting: Internal Medicine

## 2013-05-25 NOTE — Telephone Encounter (Signed)
Has form that needs to be signed off by Dr. 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  States he son will bring form 12/4.  States she also received reminder from [facility] for mammo.  Advised she can schedule herself as it is routine and she has been seen by PCP within year.  Pt will schedule mammo herself.

## 2013-05-25 NOTE — Telephone Encounter (Signed)
FYI

## 2013-06-09 ENCOUNTER — Other Ambulatory Visit: Payer: Self-pay | Admitting: *Deleted

## 2013-06-10 ENCOUNTER — Other Ambulatory Visit: Payer: Self-pay | Admitting: *Deleted

## 2013-06-10 DIAGNOSIS — K219 Gastro-esophageal reflux disease without esophagitis: Secondary | ICD-10-CM

## 2013-06-10 DIAGNOSIS — Z780 Asymptomatic menopausal state: Secondary | ICD-10-CM

## 2013-06-10 MED ORDER — LEVOTHYROXINE SODIUM 112 MCG PO TABS: 112.0000 ug | ORAL_TABLET | Freq: Every day | ORAL | Status: DC

## 2013-06-10 MED ORDER — MELOXICAM 15 MG PO TABS: 15.0000 mg | ORAL_TABLET | Freq: Every day | ORAL | Status: DC

## 2013-06-10 MED ORDER — PANTOPRAZOLE SODIUM 40 MG PO TBEC: 40.0000 mg | DELAYED_RELEASE_TABLET | Freq: Two times a day (BID) | ORAL | Status: DC

## 2013-06-10 MED ORDER — ESTRADIOL 1 MG PO TABS: 1.0000 mg | ORAL_TABLET | Freq: Every day | ORAL | Status: DC

## 2013-06-10 MED ORDER — GABAPENTIN 300 MG PO CAPS: 300.0000 mg | ORAL_CAPSULE | Freq: Three times a day (TID) | ORAL | Status: DC

## 2013-06-10 MED ORDER — VENLAFAXINE HCL ER 75 MG PO CP24: 75.0000 mg | ORAL_CAPSULE | Freq: Every day | ORAL | Status: DC

## 2013-06-10 NOTE — Telephone Encounter (Signed)
Pt left VM, stating none of her refills were received at [facility] on 01/26/13, needing refills on Levothyroxine, Effexor, Protonix, estrace. Rx sent to pharmacy by escript

## 2013-06-10 NOTE — Telephone Encounter (Signed)
Pt also needing refill on her Meloxicam, ok to send?

## 2013-06-28 ENCOUNTER — Telehealth: Payer: Self-pay | Admitting: Internal Medicine

## 2013-06-28 NOTE — Telephone Encounter (Signed)
Last fill date was 04/15/13 okay to fill?

## 2013-06-28 NOTE — Telephone Encounter (Signed)
Percocet 5/325 mg

## 2013-06-29 ENCOUNTER — Ambulatory Visit: Payer: Self-pay | Admitting: Internal Medicine

## 2013-06-30 ENCOUNTER — Ambulatory Visit (INDEPENDENT_AMBULATORY_CARE_PROVIDER_SITE_OTHER): Payer: 59 | Admitting: Internal Medicine

## 2013-06-30 ENCOUNTER — Encounter (INDEPENDENT_AMBULATORY_CARE_PROVIDER_SITE_OTHER): Payer: Self-pay

## 2013-06-30 ENCOUNTER — Encounter: Payer: Self-pay | Admitting: Internal Medicine

## 2013-06-30 VITALS — BP 118/80 | HR 80 | Temp 97.8°F | Wt 179.0 lb

## 2013-06-30 DIAGNOSIS — N390 Urinary tract infection, site not specified: Secondary | ICD-10-CM

## 2013-06-30 DIAGNOSIS — M5106 Intervertebral disc disorders with myelopathy, lumbar region: Secondary | ICD-10-CM

## 2013-06-30 LAB — POCT URINALYSIS DIPSTICK
Bilirubin, UA: NEGATIVE
Blood, UA: NEGATIVE
Glucose, UA: NEGATIVE
Leukocytes, UA: NEGATIVE
Nitrite, UA: NEGATIVE
Spec Grav, UA: >=1.03
UROBILINOGEN UA: 0.2
pH, UA: 5.5

## 2013-06-30 MED ORDER — NITROFURANTOIN MACROCRYSTAL 100 MG PO CAPS: 100.0000 mg | ORAL_CAPSULE | Freq: Four times a day (QID) | ORAL | Status: DC

## 2013-06-30 MED ORDER — OXYCODONE-ACETAMINOPHEN 5-325 MG PO TABS: 1.0000 | ORAL_TABLET | Freq: Every evening | ORAL | Status: DC | PRN

## 2013-06-30 MED ORDER — FLUCONAZOLE 150 MG PO TABS: 150.0000 mg | ORAL_TABLET | Freq: Once | ORAL | Status: DC

## 2013-06-30 NOTE — Progress Notes (Signed)
Subjective:    Patient ID: Natasha Pitts, female    DOB: [birth year], 53 y.o.   MRN: [id number]  HPI 52YO female presents for acute visit complaining of dysuria described as "heaviness." Works in urgent care as a Engineer, civil (consulting) and had urinalysis at [facility] Sunday pos for blood, leukocytes. Started on Cipro Sunday. No improvement after 4 days of antibiotics. Denies fever, chills, flank pain. Notes some sediment in her urine.  Outpatient Prescriptions Prior to Visit  Medication Sig Dispense Refill  . albuterol (PROVENTIL HFA;VENTOLIN HFA) 108 (90 BASE) MCG/ACT inhaler Inhale 2 puffs into the lungs every 6 (six) hours as needed for wheezing.  1 Inhaler  2  . estradiol (ESTRACE) 1 MG tablet Take 1 tablet (1 mg total) by mouth daily.  90 tablet  3  . levothyroxine (SYNTHROID, LEVOTHROID) 112 MCG tablet Take 1 tablet (112 mcg total) by mouth daily.  90 tablet  3  . meloxicam (MOBIC) 15 MG tablet Take 1 tablet (15 mg total) by mouth daily.  90 tablet  1  . pantoprazole (PROTONIX) 40 MG tablet Take 1 tablet (40 mg total) by mouth 2 (two) times daily.  60 tablet  11  . venlafaxine XR (EFFEXOR-XR) 75 MG 24 hr capsule Take 1 capsule (75 mg total) by mouth daily.  90 capsule  2  . gabapentin (NEURONTIN) 300 MG capsule Take 1 capsule (300 mg total) by mouth 3 (three) times daily.  90 capsule  2  . methocarbamol (ROBAXIN) 750 MG tablet Take 1 tablet (750 mg total) by mouth 3 (three) times daily.  90 tablet  6  . oxyCODONE-acetaminophen (ROXICET) 5-325 MG per tablet Take 1 tablet by mouth at bedtime as needed and may repeat dose one time if needed for pain.  60 tablet  0  . rizatriptan (MAXALT) 10 MG tablet Take 1 tablet (10 mg total) by mouth as needed for migraine. May repeat in 2 hours if needed  10 tablet  11   No facility-administered medications prior to visit.   BP 118/80  Pulse 80  Temp(Src) 97.8 F (36.6 C) (Oral)  Wt 179 lb (81.194 kg)  SpO2 97%   Review of Systems  Constitutional: Negative for fever,  chills and fatigue.  Gastrointestinal: Negative for nausea, vomiting, abdominal pain, diarrhea, constipation and rectal pain.  Genitourinary: Positive for dysuria. Negative for urgency, frequency, hematuria, flank pain, decreased urine volume, vaginal bleeding, vaginal discharge, difficulty urinating, vaginal pain and pelvic pain.       Objective:   Physical Exam  Constitutional: She is oriented to person, place, and time. She appears well-developed and well-nourished. No distress.  HENT:  Head: Normocephalic and atraumatic.  Right Ear: External ear normal.  Left Ear: External ear normal.  Nose: Nose normal.  Mouth/Throat: Oropharynx is clear and moist. No oropharyngeal exudate.  Eyes: Conjunctivae are normal. Pupils are equal, round, and reactive to light. Right eye exhibits no discharge. Left eye exhibits no discharge. No scleral icterus.  Neck: Normal range of motion. Neck supple. No tracheal deviation present. No thyromegaly present.  Cardiovascular: Normal rate, regular rhythm, normal heart sounds and intact distal pulses.  Exam reveals no gallop and no friction rub.   No murmur heard. Pulmonary/Chest: Effort normal and breath sounds normal. No accessory muscle usage. Not tachypneic. No respiratory distress. She has no decreased breath sounds. She has no wheezes. She has no rhonchi. She has no rales. She exhibits no tenderness.  Abdominal: There is no tenderness (no CVA tenderness).  Musculoskeletal:  Normal range of motion. She exhibits no edema and no tenderness.  Lymphadenopathy:    She has no cervical adenopathy.  Neurological: She is alert and oriented to person, place, and time. No cranial nerve deficit. She exhibits normal muscle tone. Coordination normal.  Skin: Skin is warm and dry. No rash noted. She is not diaphoretic. No erythema. No pallor.  Psychiatric: She has a normal mood and affect. Her behavior is normal. Judgment and thought content normal.          Assessment  & Plan:

## 2013-06-30 NOTE — Progress Notes (Signed)
Pre-visit discussion using our clinic review tool. No additional management support is needed unless otherwise documented below in the visit note.  

## 2013-06-30 NOTE — Assessment & Plan Note (Signed)
Symptoms most consistent with UTI, however repeat UA today normal. Pt has required Macrobid for UTI in the past (her report). Will try change to Macrobid. Will send urine for culture. She will call or RTC if persistent or worsening symptoms, fever, chills, or flank pain. If culture negative, may need repeat cystoscopy with Dr. Achilles Dunkope.

## 2013-07-01 LAB — CULTURE, URINE COMPREHENSIVE
Colony Count: NO GROWTH
Organism ID, Bacteria: NO GROWTH

## 2013-07-15 ENCOUNTER — Ambulatory Visit (INDEPENDENT_AMBULATORY_CARE_PROVIDER_SITE_OTHER): Payer: 59 | Admitting: Adult Health

## 2013-07-15 ENCOUNTER — Encounter: Payer: Self-pay | Admitting: Adult Health

## 2013-07-15 VITALS — BP 120/66 | HR 67 | Temp 97.6°F | Resp 12 | Wt 176.0 lb

## 2013-07-15 DIAGNOSIS — R200 Anesthesia of skin: Secondary | ICD-10-CM | POA: Insufficient documentation

## 2013-07-15 DIAGNOSIS — R209 Unspecified disturbances of skin sensation: Secondary | ICD-10-CM

## 2013-07-15 NOTE — Progress Notes (Signed)
Pre visit review using our clinic review tool, if applicable. No additional management support is needed unless otherwise documented below in the visit note. 

## 2013-07-15 NOTE — Assessment & Plan Note (Signed)
Numbness of the lateral chest wall. Complaints of thoracic spine pain. Send for MRI thoracic spine.

## 2013-07-15 NOTE — Progress Notes (Signed)
   Subjective:    Patient ID: Natasha Pitts, female    DOB: Jul 06, 1960, 53 y.o.   MRN: [id number]  HPI  Patient is a pleasant 53 year old female with history of chronic back problems mainly of her lumbar region. Today she presents with complaints of lower cervical/thoracic stabbing pain and numbness of her left arm and hand as well as numbness of the lateral left side of her chest. Symptoms began on Sunday. She has been taking Mobic, Robaxin, applying ice, and pain medication as needed. Her symptoms have not improved. She has concerned about the numbness. Pt is an Charity fundraiserN at [employer].  Past Medical History  Diagnosis Date  . Thyroid disease   . Migraines     improved with menopasue   Past Surgical History  Procedure Laterality Date  . Spine surgery    . Abdominal hysterectomy  May 1995    and left oophorectomy  . Shoulder arthroscopy w/ capsular repair  2008    [facility], with CTS     Review of Systems  Musculoskeletal: Positive for back pain and neck pain.  Neurological: Positive for numbness.  All other systems reviewed and are negative.       Objective:   Physical Exam  Constitutional: She is oriented to person, place, and time.  Appears concerned. No distress.  Cardiovascular: Normal rate and regular rhythm.   Pulmonary/Chest: Effort normal. No respiratory distress.  Musculoskeletal: Normal range of motion. She exhibits tenderness. She exhibits no edema.  Point tenderness with palpation of lower cervical spine and upper thoracic spine.  Neurological: She is alert and oriented to person, place, and time.  Abnormal sensation of the left arm and lateral chest - numbness.  Skin: Skin is warm and dry.  Psychiatric: She has a normal mood and affect. Her behavior is normal. Judgment and thought content normal.          Assessment & Plan:

## 2013-07-15 NOTE — Assessment & Plan Note (Signed)
Patient without any range of motion deficits. Has point tenderness in the lower cervical spine. Numbness of left arm and hand. MRI cervical spine.

## 2013-07-16 ENCOUNTER — Encounter: Payer: Self-pay | Admitting: Internal Medicine

## 2013-07-18 NOTE — Telephone Encounter (Signed)
Patient wondering if she really needs both MRI of her spine given her symptoms, Please advise

## 2013-07-19 NOTE — Telephone Encounter (Signed)
LVM for patient to call our office  

## 2013-07-19 NOTE — Telephone Encounter (Signed)
Pt aware that we putting the Thoracic on hold.

## 2013-07-20 ENCOUNTER — Ambulatory Visit: Payer: Self-pay | Admitting: Adult Health

## 2013-07-21 ENCOUNTER — Telehealth: Payer: Self-pay | Admitting: Adult Health

## 2013-07-21 DIAGNOSIS — M4802 Spinal stenosis, cervical region: Secondary | ICD-10-CM

## 2013-07-21 NOTE — Telephone Encounter (Signed)
Referral has been faxed to Dr. Kandice HamsPrice's office for apt

## 2013-07-21 NOTE — Telephone Encounter (Signed)
Spoke with Okey RegalCarol to inform her of her results of MRI Cervical spine showing bulges and stenosis. Referring to Dr. Havery MorosKenneth O. Price at [facility]. Need to fax med list, last note and MRI result to [phone]

## 2013-07-22 ENCOUNTER — Telehealth: Payer: Self-pay | Admitting: Internal Medicine

## 2013-07-22 NOTE — Telephone Encounter (Signed)
Left message for pt to call office. Confirm  full name is Natasha Pitts and she goes by carol.  Received records from [facility]   That has [name]

## 2013-07-22 NOTE — Telephone Encounter (Signed)
Patient called back to confirm her full name as Natasha Pitts she just prefers to be called [name].

## 2013-07-27 ENCOUNTER — Encounter: Payer: Self-pay | Admitting: Internal Medicine

## 2013-08-04 ENCOUNTER — Telehealth: Payer: Self-pay | Admitting: Internal Medicine

## 2013-08-04 DIAGNOSIS — M4802 Spinal stenosis, cervical region: Secondary | ICD-10-CM

## 2013-08-04 NOTE — Telephone Encounter (Signed)
Got it.

## 2013-08-04 NOTE — Telephone Encounter (Signed)
This is the second referral we have done.  i received a letter from Dr [facility]Price at [facility] ,  She arrived late for appt  So they offered to reschedule her or have her wait so she pulled her records

## 2013-08-04 NOTE — Telephone Encounter (Signed)
Pt states Dr. Darrick Huntsmanullo told her to see neurosurgery.  She [place] go [place] [facility] [place].  States she has spoken with them and they told her to have her records sent and a referral placed.  Pt # [phone] until 6 p.m. on 2/12 at work, or can call cell.

## 2013-08-04 NOTE — Telephone Encounter (Signed)
See note below patient would like referral to [facility].

## 2013-08-04 NOTE — Telephone Encounter (Signed)
Kathy see below 

## 2013-08-07 ENCOUNTER — Encounter: Payer: Self-pay | Admitting: Internal Medicine

## 2013-08-12 ENCOUNTER — Encounter: Payer: Self-pay | Admitting: Adult Health

## 2013-08-17 ENCOUNTER — Other Ambulatory Visit: Payer: Self-pay | Admitting: Neurosurgery

## 2013-08-22 ENCOUNTER — Other Ambulatory Visit: Payer: Self-pay | Admitting: Internal Medicine

## 2013-08-22 NOTE — Telephone Encounter (Signed)
oxyCODONE-acetaminophen (ROXICET) 5-325 MG per tablet ° °

## 2013-08-22 NOTE — Telephone Encounter (Signed)
Last non-acute visit 01/26/13, refill?

## 2013-08-23 MED ORDER — OXYCODONE-ACETAMINOPHEN 5-325 MG PO TABS: 1.0000 | ORAL_TABLET | Freq: Every evening | ORAL | Status: DC | PRN

## 2013-08-23 NOTE — Telephone Encounter (Signed)
Ok to refill,  printed rx  

## 2013-08-23 NOTE — Telephone Encounter (Signed)
Patient called and stated surgery scheduled 09/13/13  For a ruptured disk in back.

## 2013-08-23 NOTE — Telephone Encounter (Signed)
Left detailed message on cell for patient to pick up script placed at front.

## 2013-08-30 ENCOUNTER — Encounter (HOSPITAL_COMMUNITY): Payer: Self-pay | Admitting: Pharmacy Technician

## 2013-09-07 ENCOUNTER — Other Ambulatory Visit (HOSPITAL_COMMUNITY): Payer: 59

## 2013-09-13 ENCOUNTER — Ambulatory Visit (HOSPITAL_COMMUNITY): Admission: RE | Admit: 2013-09-13 | Payer: 59 | Source: Ambulatory Visit | Admitting: Neurosurgery

## 2013-09-13 ENCOUNTER — Encounter (HOSPITAL_COMMUNITY): Admission: RE | Payer: Self-pay | Source: Ambulatory Visit

## 2013-09-13 SURGERY — ANTERIOR CERVICAL DECOMPRESSION/DISCECTOMY FUSION 1 LEVEL
Anesthesia: General

## 2013-09-14 ENCOUNTER — Other Ambulatory Visit: Payer: Self-pay | Admitting: Neurosurgery

## 2013-09-19 ENCOUNTER — Encounter (HOSPITAL_COMMUNITY): Payer: Self-pay

## 2013-09-19 ENCOUNTER — Encounter (HOSPITAL_COMMUNITY)
Admission: RE | Admit: 2013-09-19 | Discharge: 2013-09-19 | Disposition: A | Discharge status: Home / Self Care | Payer: 59 | Source: Ambulatory Visit

## 2013-09-19 DIAGNOSIS — Z01812 Encounter for preprocedural laboratory examination: Secondary | ICD-10-CM | POA: Insufficient documentation

## 2013-09-19 HISTORY — DX: Nausea with vomiting, unspecified: R11.2

## 2013-09-19 HISTORY — DX: Anxiety disorder, unspecified: F41.9

## 2013-09-19 HISTORY — DX: Nausea with vomiting, unspecified: Z98.890

## 2013-09-19 HISTORY — DX: Other seasonal allergic rhinitis: J30.2

## 2013-09-19 HISTORY — DX: Hypothyroidism, unspecified: E03.9

## 2013-09-19 LAB — CBC WITH DIFFERENTIAL/PLATELET
BASOS PCT: 1 % (ref 0–1)
Basophils Absolute: 0 10*3/uL (ref 0.0–0.1)
Eosinophils Absolute: 0.2 10*3/uL (ref 0.0–0.7)
Eosinophils Relative: 4 % (ref 0–5)
HCT: 38.3 % (ref 36.0–46.0)
HEMOGLOBIN: 13.2 g/dL (ref 12.0–15.0)
Lymphocytes Relative: 30 % (ref 12–46)
Lymphs Abs: 1.3 10*3/uL (ref 0.7–4.0)
MCH: 31.3 pg (ref 26.0–34.0)
MCHC: 34.5 g/dL (ref 30.0–36.0)
MCV: 90.8 fL (ref 78.0–100.0)
MONOS PCT: 11 % (ref 3–12)
Monocytes Absolute: 0.5 10*3/uL (ref 0.1–1.0)
NEUTROS PCT: 54 % (ref 43–77)
Neutro Abs: 2.4 10*3/uL (ref 1.7–7.7)
Platelets: 278 10*3/uL (ref 150–400)
RBC: 4.22 MIL/uL (ref 3.87–5.11)
RDW: 12.4 % (ref 11.5–15.5)
WBC: 4.4 10*3/uL (ref 4.0–10.5)

## 2013-09-19 LAB — BASIC METABOLIC PANEL
BUN: 12 mg/dL (ref 6–23)
CO2: 28 mEq/L (ref 19–32)
Calcium: 9.6 mg/dL (ref 8.4–10.5)
Chloride: 104 mEq/L (ref 96–112)
Creatinine, Ser: 0.69 mg/dL (ref 0.50–1.10)
GFR calc Af Amer: >90 mL/min (ref >90)
Glucose, Bld: 125 mg/dL — ABNORMAL HIGH (ref 70–99)
POTASSIUM: 4.3 meq/L (ref 3.7–5.3)
SODIUM: 143 meq/L (ref 137–147)

## 2013-09-19 LAB — SURGICAL PCR SCREEN
MRSA, PCR: NEGATIVE
Staphylococcus aureus: NEGATIVE

## 2013-09-19 NOTE — Pre-Procedure Instructions (Signed)
EWA HIPP  09/19/2013   Your procedure is scheduled on:  Tuesday April 7 th at 1243 PM  Report to [facility] Main Entrance "A" at 1043 AM.  Call this number if you have problems the morning of surgery: [phone]   Remember:   Do not eat food or drink liquids after midnight Monday   Take these medicines the morning of surgery with A SIP OF WATER: Synthroid, pain pill if needed for pain, Protonix, and Effexor-XR. Stop Meloxicam as of today.   Do not wear jewelry, make-up or nail polish.  Do not wear lotions, powders, or perfumes. You may wear deodorant.  Do not shave 48 hours prior to surgery.  Do not bring valuables to the hospital.  [facility] is not responsible for any belongings or valuables.               Contacts, dentures or bridgework may not be worn into surgery.  Leave suitcase in the car. After surgery it may be brought to your room.  For patients admitted to the hospital, discharge time is determined by your  treatment team.               Patients discharged the day of surgery will not be allowed to drive home.    Special Instructions: [facility] - Preparing for Surgery  Before surgery, you can play an important role.  Because skin is not sterile, your skin needs to be as free of germs as possible.  You can reduce the number of germs on you skin by washing with CHG (chlorahexidine gluconate) soap before surgery.  CHG is an antiseptic cleaner which kills germs and bonds with the skin to continue killing germs even after washing.  Please DO NOT use if you have an allergy to CHG or antibacterial soaps.  If your skin becomes reddened/irritated stop using the CHG and inform your nurse when you arrive at Short Stay.  Do not shave (including legs and underarms) for at least 48 hours prior to the first CHG shower.  You may shave your face.  Please follow these instructions carefully:   1.  Shower with CHG Soap the night before surgery and the                                 morning of Surgery.  2.  If you choose to wash your hair, wash your hair first as usual with your       normal shampoo.  3.  After you shampoo, rinse your hair and body thoroughly to remove the                      Shampoo.  4.  Use CHG as you would any other liquid soap.  You can apply chg directly       to the skin and wash gently with scrungie or a clean washcloth.  5.  Apply the CHG Soap to your body ONLY FROM THE NECK DOWN.        Do not use on open wounds or open sores.  Avoid contact with your eyes,       ears, mouth and genitals (private parts).  Wash genitals (private parts)       with your normal soap.  6.  Wash thoroughly, paying special attention to the area where your surgery        will be performed.  7.  Thoroughly rinse your body with warm water from the neck down.  8.  DO NOT shower/wash with your normal soap after using and rinsing off       the CHG Soap.  9.  Pat yourself dry with a clean towel.            10.  Wear clean pajamas.            11.  Place clean sheets on your bed the night of your first shower and do not        sleep with pets.  Day of Surgery  Do not apply any lotions/deoderants the morning of surgery.  Please wear clean clothes to the hospital/surgery center.      Please read over the following fact sheets that you were given: Pain Booklet, Coughing and Deep Breathing, MRSA Information and Surgical Site Infection Prevention

## 2013-09-26 MED ORDER — CEFAZOLIN SODIUM-DEXTROSE 2-3 GM-% IV SOLR
2.0000 g | INTRAVENOUS | Status: AC
Start: 2013-09-27 — End: 2013-09-27
  Administered 2013-09-27: 2 g via INTRAVENOUS
  Filled 2013-09-26: qty 50

## 2013-09-26 MED ORDER — DEXAMETHASONE SODIUM PHOSPHATE 10 MG/ML IJ SOLN
10.0000 mg | INTRAMUSCULAR | Status: AC
  Administered 2013-09-27: 10 mg via INTRAVENOUS
  Filled 2013-09-26: qty 1

## 2013-09-26 NOTE — Progress Notes (Signed)
Left message for pt to arrive at 0530 in the morning.

## 2013-09-27 ENCOUNTER — Encounter (HOSPITAL_COMMUNITY): Payer: 59 | Admitting: Anesthesiology

## 2013-09-27 ENCOUNTER — Ambulatory Visit (HOSPITAL_COMMUNITY): Payer: 59

## 2013-09-27 ENCOUNTER — Ambulatory Visit (HOSPITAL_COMMUNITY): Payer: 59 | Admitting: Anesthesiology

## 2013-09-27 ENCOUNTER — Encounter (HOSPITAL_COMMUNITY)
Admission: RE | Disposition: A | Discharge status: Home / Self Care | Payer: Self-pay | Source: Ambulatory Visit | Attending: Neurosurgery

## 2013-09-27 ENCOUNTER — Inpatient Hospital Stay (HOSPITAL_COMMUNITY)
Admission: RE | Admit: 2013-09-27 | Discharge: 2013-09-28 | DRG: 473 | Disposition: A | Discharge status: Home / Self Care | Payer: 59 | Source: Ambulatory Visit | Attending: Neurosurgery | Admitting: Neurosurgery

## 2013-09-27 ENCOUNTER — Encounter (HOSPITAL_COMMUNITY): Payer: Self-pay | Admitting: *Deleted

## 2013-09-27 DIAGNOSIS — Z79899 Other long term (current) drug therapy: Secondary | ICD-10-CM

## 2013-09-27 DIAGNOSIS — Z833 Family history of diabetes mellitus: Secondary | ICD-10-CM

## 2013-09-27 DIAGNOSIS — Z91040 Latex allergy status: Secondary | ICD-10-CM

## 2013-09-27 DIAGNOSIS — Z823 Family history of stroke: Secondary | ICD-10-CM

## 2013-09-27 DIAGNOSIS — Z8249 Family history of ischemic heart disease and other diseases of the circulatory system: Secondary | ICD-10-CM

## 2013-09-27 DIAGNOSIS — Z888 Allergy status to other drugs, medicaments and biological substances status: Secondary | ICD-10-CM

## 2013-09-27 DIAGNOSIS — Z9089 Acquired absence of other organs: Secondary | ICD-10-CM

## 2013-09-27 DIAGNOSIS — F411 Generalized anxiety disorder: Secondary | ICD-10-CM | POA: Diagnosis present

## 2013-09-27 DIAGNOSIS — E039 Hypothyroidism, unspecified: Secondary | ICD-10-CM | POA: Diagnosis present

## 2013-09-27 DIAGNOSIS — M4722 Other spondylosis with radiculopathy, cervical region: Secondary | ICD-10-CM | POA: Diagnosis present

## 2013-09-27 DIAGNOSIS — M47812 Spondylosis without myelopathy or radiculopathy, cervical region: Principal | ICD-10-CM | POA: Diagnosis present

## 2013-09-27 HISTORY — PX: ANTERIOR CERVICAL DECOMP/DISCECTOMY FUSION: SHX1161

## 2013-09-27 SURGERY — ANTERIOR CERVICAL DECOMPRESSION/DISCECTOMY FUSION 1 LEVEL
Anesthesia: General

## 2013-09-27 MED ORDER — MIDAZOLAM HCL 2 MG/2ML IJ SOLN
INTRAMUSCULAR | Status: AC
  Filled 2013-09-27: qty 2

## 2013-09-27 MED ORDER — ACETAMINOPHEN 325 MG PO TABS: 650.0000 mg | ORAL_TABLET | ORAL | Status: DC | PRN

## 2013-09-27 MED ORDER — SODIUM CHLORIDE 0.9 % IJ SOLN
INTRAMUSCULAR | Status: AC
  Filled 2013-09-27: qty 10

## 2013-09-27 MED ORDER — SUCCINYLCHOLINE CHLORIDE 20 MG/ML IJ SOLN
INTRAMUSCULAR | Status: AC
  Filled 2013-09-27: qty 1

## 2013-09-27 MED ORDER — 0.9 % SODIUM CHLORIDE (POUR BTL) OPTIME
TOPICAL | Status: DC | PRN
  Administered 2013-09-27: 1000 mL

## 2013-09-27 MED ORDER — MELOXICAM 15 MG PO TABS
15.0000 mg | ORAL_TABLET | Freq: Every day | ORAL | Status: DC
  Administered 2013-09-27: 15 mg via ORAL
  Filled 2013-09-27 (×2): qty 1

## 2013-09-27 MED ORDER — FENTANYL CITRATE 0.05 MG/ML IJ SOLN
INTRAMUSCULAR | Status: DC | PRN
  Administered 2013-09-27 (×3): 50 ug via INTRAVENOUS
  Administered 2013-09-27: 100 ug via INTRAVENOUS

## 2013-09-27 MED ORDER — LEVOTHYROXINE SODIUM 112 MCG PO TABS
112.0000 ug | ORAL_TABLET | Freq: Every day | ORAL | Status: DC
  Administered 2013-09-28: 112 ug via ORAL
  Filled 2013-09-27 (×2): qty 1

## 2013-09-27 MED ORDER — LACTATED RINGERS IV SOLN
INTRAVENOUS | Status: DC | PRN
  Administered 2013-09-27: 07:00:00 via INTRAVENOUS

## 2013-09-27 MED ORDER — OXYCODONE-ACETAMINOPHEN 5-325 MG PO TABS
1.0000 | ORAL_TABLET | ORAL | Status: DC | PRN
  Administered 2013-09-27 – 2013-09-28 (×2): 1 via ORAL
  Filled 2013-09-27: qty 2
  Filled 2013-09-27 (×2): qty 1

## 2013-09-27 MED ORDER — THROMBIN 5000 UNITS EX SOLR
CUTANEOUS | Status: DC | PRN
  Administered 2013-09-27 (×2): 5000 [IU] via TOPICAL

## 2013-09-27 MED ORDER — GLYCOPYRROLATE 0.2 MG/ML IJ SOLN
INTRAMUSCULAR | Status: AC
  Filled 2013-09-27: qty 2

## 2013-09-27 MED ORDER — SODIUM CHLORIDE 0.9 % IV SOLN: 250.0000 mL | INTRAVENOUS | Status: DC

## 2013-09-27 MED ORDER — ONDANSETRON HCL 4 MG/2ML IJ SOLN
INTRAMUSCULAR | Status: AC
Start: 2013-09-27 — End: 2013-09-27
  Filled 2013-09-27: qty 2

## 2013-09-27 MED ORDER — HYDROMORPHONE HCL PF 1 MG/ML IJ SOLN
0.2500 mg | INTRAMUSCULAR | Status: DC | PRN
  Administered 2013-09-27 (×2): 0.5 mg via INTRAVENOUS

## 2013-09-27 MED ORDER — SODIUM CHLORIDE 0.9 % IJ SOLN
3.0000 mL | Freq: Two times a day (BID) | INTRAMUSCULAR | Status: DC
  Administered 2013-09-27: 3 mL via INTRAVENOUS

## 2013-09-27 MED ORDER — ACETAMINOPHEN 650 MG RE SUPP: 650.0000 mg | RECTAL | Status: DC | PRN

## 2013-09-27 MED ORDER — PHENYLEPHRINE HCL 10 MG/ML IJ SOLN
INTRAMUSCULAR | Status: DC | PRN
  Administered 2013-09-27 (×3): 80 ug via INTRAVENOUS

## 2013-09-27 MED ORDER — FENTANYL CITRATE 0.05 MG/ML IJ SOLN
INTRAMUSCULAR | Status: AC
  Filled 2013-09-27: qty 5

## 2013-09-27 MED ORDER — HEMOSTATIC AGENTS (NO CHARGE) OPTIME
TOPICAL | Status: DC | PRN
  Administered 2013-09-27: 1 via TOPICAL

## 2013-09-27 MED ORDER — PHENYLEPHRINE 40 MCG/ML (10ML) SYRINGE FOR IV PUSH (FOR BLOOD PRESSURE SUPPORT)
PREFILLED_SYRINGE | INTRAVENOUS | Status: AC
  Filled 2013-09-27: qty 10

## 2013-09-27 MED ORDER — PROPOFOL 10 MG/ML IV BOLUS
INTRAVENOUS | Status: DC | PRN
  Administered 2013-09-27: 160 mg via INTRAVENOUS

## 2013-09-27 MED ORDER — MENTHOL 3 MG MT LOZG: 1.0000 | LOZENGE | OROMUCOSAL | Status: DC | PRN

## 2013-09-27 MED ORDER — ROCURONIUM BROMIDE 50 MG/5ML IV SOLN
INTRAVENOUS | Status: AC
  Filled 2013-09-27: qty 1

## 2013-09-27 MED ORDER — OXYCODONE HCL 5 MG PO TABS: 5.0000 mg | ORAL_TABLET | Freq: Once | ORAL | Status: DC | PRN

## 2013-09-27 MED ORDER — PROPOFOL 10 MG/ML IV BOLUS
INTRAVENOUS | Status: AC
  Filled 2013-09-27: qty 20

## 2013-09-27 MED ORDER — ROCURONIUM BROMIDE 100 MG/10ML IV SOLN
INTRAVENOUS | Status: DC | PRN
  Administered 2013-09-27: 40 mg via INTRAVENOUS

## 2013-09-27 MED ORDER — HYDROCODONE-ACETAMINOPHEN 5-325 MG PO TABS
1.0000 | ORAL_TABLET | ORAL | Status: DC | PRN
Start: 2013-09-27 — End: 2013-09-28

## 2013-09-27 MED ORDER — NEOSTIGMINE METHYLSULFATE 1 MG/ML IJ SOLN
INTRAMUSCULAR | Status: DC | PRN
  Administered 2013-09-27: 3 mg via INTRAVENOUS

## 2013-09-27 MED ORDER — EPHEDRINE SULFATE 50 MG/ML IJ SOLN
INTRAMUSCULAR | Status: AC
  Filled 2013-09-27: qty 1

## 2013-09-27 MED ORDER — SENNA 8.6 MG PO TABS
1.0000 | ORAL_TABLET | Freq: Two times a day (BID) | ORAL | Status: DC
  Administered 2013-09-27 (×2): 8.6 mg via ORAL
  Filled 2013-09-27 (×4): qty 1

## 2013-09-27 MED ORDER — PANTOPRAZOLE SODIUM 40 MG PO TBEC
40.0000 mg | DELAYED_RELEASE_TABLET | Freq: Two times a day (BID) | ORAL | Status: DC
  Administered 2013-09-27: 40 mg via ORAL
  Filled 2013-09-27: qty 1

## 2013-09-27 MED ORDER — KETOROLAC TROMETHAMINE 30 MG/ML IJ SOLN
30.0000 mg | Freq: Four times a day (QID) | INTRAMUSCULAR | Status: DC | PRN
  Administered 2013-09-27: 30 mg via INTRAVENOUS
  Filled 2013-09-27: qty 1

## 2013-09-27 MED ORDER — CEFAZOLIN SODIUM 1-5 GM-% IV SOLN
1.0000 g | Freq: Three times a day (TID) | INTRAVENOUS | Status: AC
  Administered 2013-09-27 (×2): 1 g via INTRAVENOUS
  Filled 2013-09-27 (×2): qty 50

## 2013-09-27 MED ORDER — DEXAMETHASONE SODIUM PHOSPHATE 4 MG/ML IJ SOLN
INTRAMUSCULAR | Status: AC
  Filled 2013-09-27: qty 1

## 2013-09-27 MED ORDER — NEOSTIGMINE METHYLSULFATE 1 MG/ML IJ SOLN
INTRAMUSCULAR | Status: AC
  Filled 2013-09-27: qty 10

## 2013-09-27 MED ORDER — PHENOL 1.4 % MT LIQD: 1.0000 | OROMUCOSAL | Status: DC | PRN

## 2013-09-27 MED ORDER — LIDOCAINE HCL (CARDIAC) 20 MG/ML IV SOLN
INTRAVENOUS | Status: DC | PRN
  Administered 2013-09-27: 40 mg via INTRAVENOUS

## 2013-09-27 MED ORDER — ONDANSETRON HCL 4 MG/2ML IJ SOLN
4.0000 mg | INTRAMUSCULAR | Status: DC | PRN
  Administered 2013-09-27: 4 mg via INTRAVENOUS
  Filled 2013-09-27: qty 2

## 2013-09-27 MED ORDER — ALUM & MAG HYDROXIDE-SIMETH 200-200-20 MG/5ML PO SUSP: 30.0000 mL | Freq: Four times a day (QID) | ORAL | Status: DC | PRN

## 2013-09-27 MED ORDER — MIDAZOLAM HCL 5 MG/5ML IJ SOLN
INTRAMUSCULAR | Status: DC | PRN
  Administered 2013-09-27: 2 mg via INTRAVENOUS

## 2013-09-27 MED ORDER — GABAPENTIN 300 MG PO CAPS
300.0000 mg | ORAL_CAPSULE | Freq: Every day | ORAL | Status: DC
  Administered 2013-09-27: 300 mg via ORAL
  Filled 2013-09-27 (×2): qty 1

## 2013-09-27 MED ORDER — OXYCODONE HCL 5 MG/5ML PO SOLN: 5.0000 mg | Freq: Once | ORAL | Status: DC | PRN

## 2013-09-27 MED ORDER — HYDROMORPHONE HCL PF 1 MG/ML IJ SOLN: 0.5000 mg | INTRAMUSCULAR | Status: DC | PRN

## 2013-09-27 MED ORDER — ONDANSETRON HCL 4 MG/2ML IJ SOLN
INTRAMUSCULAR | Status: DC | PRN
  Administered 2013-09-27: 4 mg via INTRAVENOUS

## 2013-09-27 MED ORDER — SODIUM CHLORIDE 0.9 % IR SOLN
Status: DC | PRN
  Administered 2013-09-27: 08:00:00

## 2013-09-27 MED ORDER — GLYCOPYRROLATE 0.2 MG/ML IJ SOLN
INTRAMUSCULAR | Status: DC | PRN
  Administered 2013-09-27: 0.4 mg via INTRAVENOUS

## 2013-09-27 MED ORDER — ESTRADIOL 1 MG PO TABS
1.0000 mg | ORAL_TABLET | Freq: Every day | ORAL | Status: DC
  Administered 2013-09-27: 1 mg via ORAL
  Filled 2013-09-27 (×2): qty 1

## 2013-09-27 MED ORDER — PROMETHAZINE HCL 25 MG PO TABS
12.5000 mg | ORAL_TABLET | Freq: Four times a day (QID) | ORAL | Status: DC | PRN
  Administered 2013-09-27: 12.5 mg via ORAL
  Filled 2013-09-27: qty 1

## 2013-09-27 MED ORDER — HYDROMORPHONE HCL PF 1 MG/ML IJ SOLN
INTRAMUSCULAR | Status: AC
  Filled 2013-09-27: qty 1

## 2013-09-27 MED ORDER — CYCLOBENZAPRINE HCL 10 MG PO TABS: 10.0000 mg | ORAL_TABLET | Freq: Three times a day (TID) | ORAL | Status: DC | PRN

## 2013-09-27 MED ORDER — SODIUM CHLORIDE 0.9 % IJ SOLN: 3.0000 mL | INTRAMUSCULAR | Status: DC | PRN

## 2013-09-27 MED ORDER — METHOCARBAMOL 750 MG PO TABS
750.0000 mg | ORAL_TABLET | Freq: Every day | ORAL | Status: DC
  Administered 2013-09-27: 750 mg via ORAL
  Filled 2013-09-27 (×2): qty 1

## 2013-09-27 MED ORDER — LIDOCAINE HCL (CARDIAC) 20 MG/ML IV SOLN
INTRAVENOUS | Status: AC
  Filled 2013-09-27: qty 5

## 2013-09-27 MED ORDER — VENLAFAXINE HCL ER 75 MG PO CP24
75.0000 mg | ORAL_CAPSULE | Freq: Every day | ORAL | Status: DC
  Filled 2013-09-27: qty 1

## 2013-09-27 MED ORDER — ONDANSETRON HCL 4 MG/2ML IJ SOLN
4.0000 mg | Freq: Once | INTRAMUSCULAR | Status: DC | PRN
Start: 2013-09-27 — End: 2013-09-27

## 2013-09-27 SURGICAL SUPPLY — 51 items
BAG DECANTER FOR FLEXI CONT (MISCELLANEOUS) ×3 IMPLANT
BENZOIN TINCTURE PRP APPL 2/3 (GAUZE/BANDAGES/DRESSINGS) ×3 IMPLANT
BRUSH SCRUB EZ PLAIN DRY (MISCELLANEOUS) ×3 IMPLANT
BUR MATCHSTICK NEURO 3.0 LAGG (BURR) ×3 IMPLANT
CANISTER SUCT 3000ML (MISCELLANEOUS) ×3 IMPLANT
CLOSURE WOUND 1/2 X4 (GAUZE/BANDAGES/DRESSINGS) ×1
CONT SPEC 4OZ CLIKSEAL STRL BL (MISCELLANEOUS) ×3 IMPLANT
DRAPE C-ARM 42X72 X-RAY (DRAPES) ×6 IMPLANT
DRAPE LAPAROTOMY 100X72 PEDS (DRAPES) ×3 IMPLANT
DRAPE MICROSCOPE ZEISS OPMI (DRAPES) ×3 IMPLANT
DRAPE POUCH INSTRU U-SHP 10X18 (DRAPES) ×3 IMPLANT
DRILL BIT (BIT) ×3 IMPLANT
DURAPREP 6ML APPLICATOR 50/CS (WOUND CARE) ×3 IMPLANT
ELECT COATED BLADE 2.86 ST (ELECTRODE) ×3 IMPLANT
ELECT REM PT RETURN 9FT ADLT (ELECTROSURGICAL) ×3
ELECTRODE REM PT RTRN 9FT ADLT (ELECTROSURGICAL) ×1 IMPLANT
GAUZE SPONGE 4X4 16PLY XRAY LF (GAUZE/BANDAGES/DRESSINGS) IMPLANT
GLOVE ECLIPSE 8.5 STRL (GLOVE) ×3 IMPLANT
GLOVE EXAM NITRILE LRG STRL (GLOVE) IMPLANT
GLOVE EXAM NITRILE MD LF STRL (GLOVE) IMPLANT
GLOVE EXAM NITRILE XL STR (GLOVE) IMPLANT
GLOVE EXAM NITRILE XS STR PU (GLOVE) IMPLANT
GLOVE SS PI 9.0 STRL (GLOVE) ×3 IMPLANT
GOWN BRE IMP SLV AUR LG STRL (GOWN DISPOSABLE) IMPLANT
GOWN BRE IMP SLV AUR XL STRL (GOWN DISPOSABLE) IMPLANT
GOWN STRL REIN 2XL LVL4 (GOWN DISPOSABLE) IMPLANT
HALTER HD/CHIN CERV TRACTION D (MISCELLANEOUS) ×3 IMPLANT
KIT BASIN OR (CUSTOM PROCEDURE TRAY) ×3 IMPLANT
KIT ROOM TURNOVER OR (KITS) ×3 IMPLANT
NEEDLE SPNL 20GX3.5 QUINCKE YW (NEEDLE) ×3 IMPLANT
NS IRRIG 1000ML POUR BTL (IV SOLUTION) ×3 IMPLANT
PACK LAMINECTOMY NEURO (CUSTOM PROCEDURE TRAY) ×3 IMPLANT
PAD ARMBOARD 7.5X6 YLW CONV (MISCELLANEOUS) ×9 IMPLANT
PEEK CAGE 7X14X11 (Cage) ×3 IMPLANT
PLATE ELITE 30MM (Plate) ×3 IMPLANT
RUBBERBAND STERILE (MISCELLANEOUS) ×6 IMPLANT
SCREW ST 13X4XST VA NS SPNE (Screw) ×4 IMPLANT
SCREW ST VAR 4 ATL (Screw) ×8 IMPLANT
SPONGE GAUZE 4X4 12PLY (GAUZE/BANDAGES/DRESSINGS) ×3 IMPLANT
SPONGE INTESTINAL PEANUT (DISPOSABLE) ×3 IMPLANT
SPONGE SURGIFOAM ABS GEL SZ50 (HEMOSTASIS) ×3 IMPLANT
STRIP CLOSURE SKIN 1/2X4 (GAUZE/BANDAGES/DRESSINGS) ×2 IMPLANT
SUT PDS AB 5-0 P3 18 (SUTURE) ×3 IMPLANT
SUT VIC AB 3-0 SH 8-18 (SUTURE) ×3 IMPLANT
SYR 20ML ECCENTRIC (SYRINGE) ×3 IMPLANT
TAPE CLOTH 4X10 WHT NS (GAUZE/BANDAGES/DRESSINGS) ×3 IMPLANT
TAPE CLOTH SURG 4X10 WHT LF (GAUZE/BANDAGES/DRESSINGS) ×3 IMPLANT
TOWEL OR 17X24 6PK STRL BLUE (TOWEL DISPOSABLE) ×3 IMPLANT
TOWEL OR 17X26 10 PK STRL BLUE (TOWEL DISPOSABLE) ×3 IMPLANT
TRAP SPECIMEN MUCOUS 40CC (MISCELLANEOUS) ×3 IMPLANT
WATER STERILE IRR 1000ML POUR (IV SOLUTION) ×3 IMPLANT

## 2013-09-27 NOTE — Op Note (Signed)
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  Asst. Surgeon: Jeral FruitBotero  Anesthesia: General  Indication: 53 year old female with persistent neck pain with left ME pain anterior seizures failing conservative management and consistent anomaly with a left-sided C7 radiculopathy. Workup demonstrates evidence of a significant leftward C6-7 disc herniation and associated spondylosis causing compression of the left lateral spinal cord and left C7 nerve root. Patient presents now for C6-7 anterior cervical decompression and fusion.  Operative note: After induction of anesthesia, patient position supine with Extended and held in place of halter traction. Patient's anterior cervical region was prepped and draped sterilely. Incision is made overlying C6-7. Dissection proceeded down to the platysma. The platysma was then divided vertically and dissection proceeded along the medial border of the sternocleidomastoid muscle and carotid sheath. Trachea and esophagus are mobilized retracted towards the left. Prevertebral fascia which are from the anterior spinal column. Longus coli muscle was elevated by the lateral using electrocautery. Self care was placed intraoperative x-ray was taken level was confirmed. Disc space at C6-7 was then incised with a 15 blade in a rectangular fashion. A wide disc space that was achieved using pituitary rongeurs forward and backward angle Karlin curettes and a high-speed drill. All elements of the disc were removed down to the low with posterior annulus. Bone was harvested for later use as autograft and. Microscope was brought into the field and used  at the remainder of the discectomy. Remaining aspects of annulus and osteophytes removed using a high-speed drill down to the level of the posterior longitudinal ligament. Posterior lateral limb is elevated resected piecemeal fashion. Underlying thecal sac was identified. A white center decompressions and perform a cutting the bodies of C6 and C7. Decompression and CHO foramen. Wide anterior foraminotomies were then performed on course exiting C7 nerve roots bilaterally. This completely resected all elements the disc herniation and associated spondylosis. At this point a very thorough decompression and achieved. Wounds irrigated on solution. Gelfoam was placed for hemostasis which was found to be good. A 7 mm nitrite anatomic peek cage was packed with morcellized autograft. This is back into place and recessed slightly the anterior cortical margin of C6 and C7. A 25 mg as anterior cervical plate was then placed over the C6 and C7 levels. This then attached under fluoroscopic guidance using 13 mm verbal and the screws 2 each at both levels. All 4 screws were given final tightening be solidly within the bone. Locking screws herniation at both levels. Final images reveal good position of the graft and hardware at the proper level with normal alignment of the spine. Wound is an area one final time. Hemostasis was assured with bipolar [place] wounds and closed in layers. Steri-Strips and dressing were applied. There were no apparent complications. Patient tolerated the procedure well and she returns to the recovery room postop.

## 2013-09-27 NOTE — Anesthesia Preprocedure Evaluation (Addendum)
Anesthesia Evaluation  Patient identified by MRN, date of birth, ID band Patient awake    Reviewed: Allergy & Precautions, H&P , NPO status , Patient's Chart, lab work & pertinent test results  Airway Mallampati: II TM Distance: >3 FB Neck ROM: Full    Dental  (+) Teeth Intact, Dental Advisory Given   Pulmonary  breath sounds clear to auscultation        Cardiovascular Rhythm:Regular Rate:Normal     Neuro/Psych    GI/Hepatic   Endo/Other    Renal/GU      Musculoskeletal   Abdominal   Peds  Hematology   Anesthesia Other Findings   Reproductive/Obstetrics                           Anesthesia Physical Anesthesia Plan  ASA: II  Anesthesia Plan: General   Post-op Pain Management:    Induction: Intravenous  Airway Management Planned: Oral ETT  Additional Equipment:   Intra-op Plan:   Post-operative Plan: Extubation in OR  Informed Consent: I have reviewed the patients History and Physical, chart, labs and discussed the procedure including the risks, benefits and alternatives for the proposed anesthesia with the patient or authorized representative who has indicated his/her understanding and acceptance.   Dental advisory given  Plan Discussed with: CRNA and Anesthesiologist  Anesthesia Plan Comments: (HNP C-6-7 H/O Post-op N/V Migraines  Plan GA with oral ETT  Kipp Broodavid Joslin, MD)        Anesthesia Quick Evaluation

## 2013-09-27 NOTE — Brief Op Note (Signed)
09/27/2013  9:18 AM  PATIENT:  Beatrice Lecherarolyn B Riviello  53 y.o. female  PRE-OPERATIVE DIAGNOSIS:  Herniated Nucleus Pulposus  POST-OPERATIVE DIAGNOSIS:  Herniated Nucleus Pulposus  PROCEDURE:  Procedure(s) with comments: ANTERIOR CERVICAL DECOMPRESSION/DISCECTOMY FUSION Cervical Six-Seven (N/A) - ANTERIOR CERVICAL DECOMPRESSION/DISCECTOMY FUSION Cervical Six-Seven  SURGEON:  Surgeon(s) and Role:    * Temple PaciniHenry A Loriel Diehl, MD - Primary    * Karn CassisErnesto M Botero, MD - Assisting  PHYSICIAN ASSISTANT:   ASSISTANTS:    ANESTHESIA:   general  EBL:  Total I/O In: -  Out: 75 [Blood:75]  BLOOD ADMINISTERED:none  DRAINS: none   LOCAL MEDICATIONS USED:  NONE  SPECIMEN:  No Specimen  DISPOSITION OF SPECIMEN:  N/A  COUNTS:  YES  TOURNIQUET:  * No tourniquets in log *  DICTATION: .Dragon Dictation  PLAN OF CARE: Admit to inpatient   PATIENT DISPOSITION:  PACU - hemodynamically stable.   Delay start of Pharmacological VTE agent (>24hrs) due to surgical blood loss or risk of bleeding: yes

## 2013-09-27 NOTE — Anesthesia Postprocedure Evaluation (Signed)
  Anesthesia Post-op Note  Patient: Beatrice LecherCarolyn B Whetstine  Procedure(s) Performed: Procedure(s) with comments: ANTERIOR CERVICAL DECOMPRESSION/DISCECTOMY FUSION Cervical Six-Seven (N/A) - ANTERIOR CERVICAL DECOMPRESSION/DISCECTOMY FUSION Cervical Six-Seven  Patient Location: PACU  Anesthesia Type:General  Level of Consciousness: awake, alert  and oriented  Airway and Oxygen Therapy: Patient Spontanous Breathing and Patient connected to nasal cannula oxygen  Post-op Pain: mild  Post-op Assessment: Post-op Vital signs reviewed, Patient's Cardiovascular Status Stable, Respiratory Function Stable, Patent Airway, No signs of Nausea or vomiting and Pain level controlled  Post-op Vital Signs: stable  Complications: No apparent anesthesia complications

## 2013-09-27 NOTE — Transfer of Care (Signed)
Immediate Anesthesia Transfer of Care Note  Patient: Natasha Pitts  Procedure(s) Performed: Procedure(s) with comments: ANTERIOR CERVICAL DECOMPRESSION/DISCECTOMY FUSION Cervical Six-Seven (N/A) - ANTERIOR CERVICAL DECOMPRESSION/DISCECTOMY FUSION Cervical Six-Seven  Patient Location: PACU  Anesthesia Type:General  Level of Consciousness: sedated  Airway & Oxygen Therapy: Patient Spontanous Breathing and Patient connected to nasal cannula oxygen  Post-op Assessment: Report given to PACU RN, Post -op Vital signs reviewed and stable and Patient moving all extremities  Post vital signs: Reviewed and stable  Complications: No apparent anesthesia complications

## 2013-09-27 NOTE — H&P (Signed)
Natasha LecherCarolyn B Pitts is an 53 y.o. female.   Chief Complaint: Neck and left arm pain HPI: 53 year old female with neck and left arm pain with associated numbness. Workup demonstrates evidence of a significant leftward C6-7 disc herniation with spinal cord and exiting C7 nerve root compression. Patient presents now for anterior cervical decompression infusion in hopes of improving her symptoms.  Past Medical History  Diagnosis Date  . Thyroid disease   . Migraines     improved with menopasue  . PONV (postoperative nausea and vomiting)   . Hypothyroidism   . Seasonal allergies   . Anxiety     Past Surgical History  Procedure Laterality Date  . Spine surgery    . Abdominal hysterectomy  May 1995    and left oophorectomy  . Shoulder arthroscopy w/ capsular repair  2008    [facility], with CTS   . Cholecystectomy    . Nasal sinus surgery Bilateral     Family History  Problem Relation Age of Onset  . Hyperlipidemia Mother   . Hypothyroidism Mother   . Hypothyroidism Father   . Hypothyroidism Sister   . Hypothyroidism Brother   . Hypothyroidism Son   . Heart disease Maternal Grandmother   . Heart disease Maternal Grandfather   . Diabetes Paternal Grandmother   . Stroke Paternal Grandmother   . Cancer Paternal Grandfather     lung   Social History:  reports that she has never smoked. She has never used smokeless tobacco. She reports that she does not drink alcohol or use illicit drugs.  Allergies:  Allergies  Allergen Reactions  . Imitrex [Sumatriptan Base] Hives  . Latex Hives and Itching    Medications Prior to Admission  Medication Sig Dispense Refill  . estradiol (ESTRACE) 1 MG tablet Take 1 tablet (1 mg total) by mouth daily.  90 tablet  3  . gabapentin (NEURONTIN) 300 MG capsule Take 300 mg by mouth at bedtime.       Marland Kitchen. levothyroxine (SYNTHROID, LEVOTHROID) 112 MCG tablet Take 1 tablet (112 mcg total) by mouth daily.  90 tablet  3  . methocarbamol (ROBAXIN) 750 MG tablet  Take 750 mg by mouth at bedtime.      Marland Kitchen. oxyCODONE-acetaminophen (PERCOCET/ROXICET) 5-325 MG per tablet Take 1 tablet by mouth at bedtime as needed (pain).      . pantoprazole (PROTONIX) 40 MG tablet Take 1 tablet (40 mg total) by mouth 2 (two) times daily.  60 tablet  11  . venlafaxine XR (EFFEXOR-XR) 75 MG 24 hr capsule Take 1 capsule (75 mg total) by mouth daily.  90 capsule  2  . meloxicam (MOBIC) 15 MG tablet Take 1 tablet (15 mg total) by mouth daily.  90 tablet  1  . rizatriptan (MAXALT) 10 MG tablet Take 10 mg by mouth as needed for migraine. May repeat in 2 hours if needed        No results found for this or any previous visit (from the past 48 hour(s)). No results found.  Review of Systems  Constitutional: Negative.   HENT: Negative.   Eyes: Negative.   Respiratory: Negative.   Cardiovascular: Negative.   Gastrointestinal: Negative.   Genitourinary: Negative.   Musculoskeletal: Negative.   Skin: Negative.   Neurological: Negative.   Endo/Heme/Allergies: Negative.   Psychiatric/Behavioral: Negative.     Blood pressure 146/63, pulse 77, temperature 98.2 F (36.8 C), temperature source Oral, resp. rate 20, SpO2 98.00%. Physical Exam  Constitutional: She is oriented to person,  place, and time. She appears well-developed and well-nourished. No distress.  HENT:  Head: Normocephalic and atraumatic.  Right Ear: External ear normal.  Left Ear: External ear normal.  Nose: Nose normal.  Mouth/Throat: Oropharynx is clear and moist. No oropharyngeal exudate.  Eyes: Conjunctivae and EOM are normal. Pupils are equal, round, and reactive to light. Right eye exhibits no discharge. Left eye exhibits no discharge.  Neck: Normal range of motion. Neck supple. No tracheal deviation present. No thyromegaly present.  Cardiovascular: Normal rate, regular rhythm, normal heart sounds and intact distal pulses.  Exam reveals no friction rub.   No murmur heard. Respiratory: Effort normal and  breath sounds normal. No respiratory distress. She has no wheezes.  GI: Soft. Bowel sounds are normal. She exhibits no distension. There is no tenderness.  Musculoskeletal: Normal range of motion. She exhibits no edema and no tenderness.  Neurological: She is alert and oriented to person, place, and time. She has normal reflexes. She displays normal reflexes. No cranial nerve deficit. She exhibits normal muscle tone. Coordination normal.  Skin: Skin is warm and dry. She is not diaphoretic.  Psychiatric: She has a normal mood and affect. Her behavior is normal. Judgment and thought content normal.     Assessment/Plan C6-7 spondylosis and associated disc herniation with radiculopathy. Plan C6-7 anterior cervical discectomy with interbody cage and local autograft with anterior plating instrumentation. Her risks and benefits have been explained. Patient wishes to proceed.  Hollan Philipp A 09/27/2013, 7:40 AM

## 2013-09-27 NOTE — Progress Notes (Signed)
Utilization review completed.  

## 2013-09-27 NOTE — Anesthesia Procedure Notes (Addendum)
Procedure Name: Intubation Date/Time: 09/27/2013 7:54 AM Performed by: Quentin OreWALKER, Lileigh Fahringer E Pre-anesthesia Checklist: Patient identified, Emergency Drugs available, Suction available, Patient being monitored and Timeout performed Patient Re-evaluated:Patient Re-evaluated prior to inductionOxygen Delivery Method: Circle system utilized Preoxygenation: Pre-oxygenation with 100% oxygen Intubation Type: IV induction Ventilation: Mask ventilation without difficulty and Oral airway inserted - appropriate to patient size Laryngoscope Size: Mac and 3 Grade View: Grade III Tube type: Oral Tube size: 7.5 mm Number of attempts: 2 Airway Equipment and Method: Bougie stylet and Stylet Placement Confirmation: positive ETCO2 and breath sounds checked- equal and bilateral Secured at: 20 cm Tube secured with: Tape Dental Injury: Teeth and Oropharynx as per pre-operative assessment  Comments: DL x1 with MAC 3, unable to visualize VC per CRNA. 1st attempt in esophagus, removed ETT. 2nd DL successful with bougie stylet per CRNA. +ETCO2 & BBS =.

## 2013-09-28 MED ORDER — CYCLOBENZAPRINE HCL 10 MG PO TABS: 10.0000 mg | ORAL_TABLET | Freq: Three times a day (TID) | ORAL | Status: DC | PRN

## 2013-09-28 MED ORDER — HYDROCODONE-ACETAMINOPHEN 5-325 MG PO TABS: 1.0000 | ORAL_TABLET | ORAL | Status: DC | PRN

## 2013-09-28 NOTE — Discharge Instructions (Signed)
Wound Care °Keep incision area dry.  °You may remove outer bandage after 2 days and shower.  ° If you shower prior cover incision with plastic wrap.  °Do not put any creams, lotions, or ointments on incision. °Leave steri-strips on neck.  They will fall off by themselves. °Activity °Walk each and every day, increasing distance each day. °No lifting greater than 5 lbs.  Avoid excessive neck motion. °No driving for 2 weeks; may ride as a passenger locally. °Wear neck brace at all times except when showering. °Diet °Resume your normal diet.  °Return to Work °Will be discussed at you follow up appointment. °Call Your Doctor If Any of These Occur °Redness, drainage, or swelling at the wound.  °Temperature greater than 101 degrees. °Severe pain not relieved by pain medication. °Increased difficulty swallowing.  °Incision starts to come apart. °Follow Up Appt °Call today and ask for Rebecca for appointment in 1-2 weeks ([phone]) or for problems.  If you have any hardware placed in your spine, you will need an x-ray before your appointment. ° ° °Wound Care °Keep incision area dry.  °You may remove outer bandage after 2 days and shower.  ° If you shower prior cover incision with plastic wrap.  °Do not put any creams, lotions, or ointments on incision. °Leave steri-strips on neck.  They will fall off by themselves. °Activity °Walk each and every day, increasing distance each day. °No lifting greater than 5 lbs.  Avoid excessive neck motion. °No driving for 2 weeks; may ride as a passenger locally. °Wear neck brace at all times except when showering. °Diet °Resume your normal diet.  °Return to Work °Will be discussed at you follow up appointment. °Call Your Doctor If Any of These Occur °Redness, drainage, or swelling at the wound.  °Temperature greater than 101 degrees. °Severe pain not relieved by pain medication. °Increased difficulty swallowing.  °Incision starts to come apart. °Follow Up Appt °Call today and ask for  Rebecca for appointment in 1-2 weeks ([phone]) or for problems.  If you have any hardware placed in your spine, you will need an x-ray before your appointment. °

## 2013-09-28 NOTE — Progress Notes (Signed)
Pt doing well. Pt given D/C instructions with Rx, verbal understanding was given. Pt's IV was D/C'd prior to D/C. Pt D/C'd home via walking @ 1100 per MD order. Pt is stable @ D/C and has no other needs. Rema FendtAshley Lunabella Badgett, RN

## 2013-09-28 NOTE — Discharge Summary (Signed)
Physician Discharge Summary  Patient ID: Natasha LecherCarolyn B Pitts MRN: [id number] DOB/AGE: 25-Aug-1960 53 y.o.  Admit date: 09/27/2013 Discharge date: 09/28/2013  Admission Diagnoses:  Discharge Diagnoses:  Principal Problem:   Cervical spondylosis without myelopathy Active Problems:   Cervical spondylosis with radiculopathy   Discharged Condition: good  Hospital Course: Patient admitted to hospital where she underwent a complicated C6-7 anterior cervical discectomy and fusion. Postoperatively she's doing very well. Neck and upper extremity pain very much improved. Patient has been ambulating the hall and ready for discharge home.  Consults:   Significant Diagnostic Studies:   Treatments:   Discharge Exam: Blood pressure 126/69, pulse 68, temperature 98.8 F (37.1 C), temperature source Oral, resp. rate 18, SpO2 97.00%. Awake alert oriented and appropriate good motor sensory function intact. Wound clean and dry. Chest and abdomen is benign.  Disposition: Final discharge disposition not confirmed     Medication List         cyclobenzaprine 10 MG tablet  Commonly known as:  FLEXERIL  Take 1 tablet (10 mg total) by mouth 3 (three) times daily as needed for muscle spasms.     estradiol 1 MG tablet  Commonly known as:  ESTRACE  Take 1 tablet (1 mg total) by mouth daily.     gabapentin 300 MG capsule  Commonly known as:  NEURONTIN  Take 300 mg by mouth at bedtime.     HYDROcodone-acetaminophen 5-325 MG per tablet  Commonly known as:  NORCO/VICODIN  Take 1-2 tablets by mouth every 4 (four) hours as needed for moderate pain.     levothyroxine 112 MCG tablet  Commonly known as:  SYNTHROID, LEVOTHROID  Take 1 tablet (112 mcg total) by mouth daily.     meloxicam 15 MG tablet  Commonly known as:  MOBIC  Take 1 tablet (15 mg total) by mouth daily.     methocarbamol 750 MG tablet  Commonly known as:  ROBAXIN  Take 750 mg by mouth at bedtime.     oxyCODONE-acetaminophen 5-325 MG  per tablet  Commonly known as:  PERCOCET/ROXICET  Take 1 tablet by mouth at bedtime as needed (pain).     pantoprazole 40 MG tablet  Commonly known as:  PROTONIX  Take 1 tablet (40 mg total) by mouth 2 (two) times daily.     rizatriptan 10 MG tablet  Commonly known as:  MAXALT  Take 10 mg by mouth as needed for migraine. May repeat in 2 hours if needed     venlafaxine XR 75 MG 24 hr capsule  Commonly known as:  EFFEXOR-XR  Take 1 capsule (75 mg total) by mouth daily.           Follow-up Information   Follow up with Temple PaciniPOOL,Zakarie Sturdivant A, MD.   Specialty:  Neurosurgery   Contact information:   [address] [place] [address] [phone]       Signed: Kathaleen MaserHenry A Gerell Fortson 09/28/2013, 10:06 AM

## 2013-10-03 ENCOUNTER — Encounter (HOSPITAL_COMMUNITY): Payer: Self-pay | Admitting: Neurosurgery

## 2013-10-06 ENCOUNTER — Encounter: Payer: Self-pay | Admitting: Internal Medicine

## 2013-12-12 ENCOUNTER — Encounter: Payer: Self-pay | Admitting: Internal Medicine

## 2013-12-12 ENCOUNTER — Other Ambulatory Visit: Payer: Self-pay | Admitting: Internal Medicine

## 2013-12-12 NOTE — Telephone Encounter (Signed)
See mychart message. Refill?

## 2013-12-12 NOTE — Telephone Encounter (Signed)
Okay to refill? 

## 2013-12-20 MED ORDER — HYDROCODONE-ACETAMINOPHEN 5-325 MG PO TABS: 1.0000 | ORAL_TABLET | ORAL | Status: DC | PRN

## 2013-12-20 NOTE — Telephone Encounter (Signed)
I have never seen this message before dated 6/22.  Refill on vicodin authorized.

## 2013-12-30 ENCOUNTER — Other Ambulatory Visit: Payer: Self-pay | Admitting: Internal Medicine

## 2013-12-30 NOTE — Telephone Encounter (Signed)
Ok to fill 

## 2013-12-31 NOTE — Telephone Encounter (Signed)
Ok to refill,  Refill sent  

## 2014-01-16 ENCOUNTER — Ambulatory Visit: Payer: Self-pay | Admitting: Internal Medicine

## 2014-02-21 ENCOUNTER — Telehealth: Payer: Self-pay | Admitting: Internal Medicine

## 2014-02-24 MED ORDER — HYDROCODONE-ACETAMINOPHEN 5-325 MG PO TABS: 1.0000 | ORAL_TABLET | ORAL | Status: DC | PRN

## 2014-02-24 NOTE — Telephone Encounter (Signed)
Script placed at front but patient needs appointment,

## 2014-02-24 NOTE — Telephone Encounter (Signed)
Ok to refill,  printed rx Has not been seen by ME since last August so she needs an appt prior to any more refills of the vicodin

## 2014-03-08 ENCOUNTER — Other Ambulatory Visit: Payer: Self-pay | Admitting: Internal Medicine

## 2014-03-08 NOTE — Telephone Encounter (Signed)
Last refill 5.12.15, last OV 1.24.15.  Please advise refill.

## 2014-03-10 NOTE — Telephone Encounter (Signed)
Ok to refill,  Refill sent  

## 2014-03-31 ENCOUNTER — Other Ambulatory Visit: Payer: Self-pay | Admitting: Internal Medicine

## 2014-05-14 ENCOUNTER — Other Ambulatory Visit: Payer: Self-pay | Admitting: Internal Medicine

## 2014-05-14 ENCOUNTER — Encounter: Payer: Self-pay | Admitting: Internal Medicine

## 2014-05-15 ENCOUNTER — Telehealth: Payer: Self-pay | Admitting: *Deleted

## 2014-05-15 DIAGNOSIS — E034 Atrophy of thyroid (acquired): Secondary | ICD-10-CM

## 2014-05-15 DIAGNOSIS — E669 Obesity, unspecified: Secondary | ICD-10-CM

## 2014-05-15 DIAGNOSIS — Z79899 Other long term (current) drug therapy: Secondary | ICD-10-CM

## 2014-05-15 NOTE — Telephone Encounter (Signed)
What labs and dx?  

## 2014-05-15 NOTE — Addendum Note (Signed)
Addended by: Sherlene ShamsULLO, Jaquelinne Glendening L on: 05/15/2014 03:24 PM   Modules accepted: Orders

## 2014-05-16 ENCOUNTER — Other Ambulatory Visit (INDEPENDENT_AMBULATORY_CARE_PROVIDER_SITE_OTHER): Payer: 59

## 2014-05-16 DIAGNOSIS — E034 Atrophy of thyroid (acquired): Secondary | ICD-10-CM

## 2014-05-16 DIAGNOSIS — E038 Other specified hypothyroidism: Secondary | ICD-10-CM

## 2014-05-16 DIAGNOSIS — E669 Obesity, unspecified: Secondary | ICD-10-CM

## 2014-05-16 DIAGNOSIS — Z79899 Other long term (current) drug therapy: Secondary | ICD-10-CM

## 2014-05-16 LAB — COMPREHENSIVE METABOLIC PANEL
ALBUMIN: 4 g/dL (ref 3.5–5.2)
ALK PHOS: 64 U/L (ref 39–117)
ALT: 17 U/L (ref 0–35)
AST: 18 U/L (ref 0–37)
BUN: 17 mg/dL (ref 6–23)
CO2: 26 mEq/L (ref 19–32)
Calcium: 9.1 mg/dL (ref 8.4–10.5)
Chloride: 105 mEq/L (ref 96–112)
Creatinine, Ser: 0.8 mg/dL (ref 0.4–1.2)
GFR: 77.31 mL/min (ref >60.00)
Glucose, Bld: 141 mg/dL — ABNORMAL HIGH (ref 70–99)
POTASSIUM: 3.9 meq/L (ref 3.5–5.1)
SODIUM: 141 meq/L (ref 135–145)
TOTAL PROTEIN: 6.8 g/dL (ref 6.0–8.3)
Total Bilirubin: 0.5 mg/dL (ref 0.2–1.2)

## 2014-05-16 LAB — LIPID PANEL
Cholesterol: 191 mg/dL (ref 0–200)
HDL: 42.5 mg/dL (ref >39.00)
LDL Cholesterol: 114 mg/dL — ABNORMAL HIGH (ref 0–99)
NONHDL: 148.5
Total CHOL/HDL Ratio: 4
Triglycerides: 175 mg/dL — ABNORMAL HIGH (ref 0.0–149.0)
VLDL: 35 mg/dL (ref 0.0–40.0)

## 2014-05-16 LAB — TSH: TSH: 0.9 u[IU]/mL (ref 0.35–4.50)

## 2014-05-19 ENCOUNTER — Other Ambulatory Visit: Payer: Self-pay | Admitting: Internal Medicine

## 2014-05-19 ENCOUNTER — Encounter: Payer: Self-pay | Admitting: Internal Medicine

## 2014-05-19 DIAGNOSIS — E669 Obesity, unspecified: Secondary | ICD-10-CM | POA: Insufficient documentation

## 2014-05-19 DIAGNOSIS — E1169 Type 2 diabetes mellitus with other specified complication: Secondary | ICD-10-CM | POA: Insufficient documentation

## 2014-05-19 DIAGNOSIS — E119 Type 2 diabetes mellitus without complications: Secondary | ICD-10-CM | POA: Insufficient documentation

## 2014-05-25 ENCOUNTER — Encounter: Payer: Self-pay | Admitting: Nurse Practitioner

## 2014-05-25 ENCOUNTER — Ambulatory Visit (INDEPENDENT_AMBULATORY_CARE_PROVIDER_SITE_OTHER): Payer: 59 | Admitting: Nurse Practitioner

## 2014-05-25 ENCOUNTER — Encounter: Payer: Self-pay | Admitting: Internal Medicine

## 2014-05-25 ENCOUNTER — Encounter: Payer: Self-pay | Admitting: *Deleted

## 2014-05-25 VITALS — BP 110/62 | HR 70 | Resp 14 | Ht 59.0 in | Wt 175.8 lb

## 2014-05-25 DIAGNOSIS — E119 Type 2 diabetes mellitus without complications: Secondary | ICD-10-CM

## 2014-05-25 DIAGNOSIS — M5106 Intervertebral disc disorders with myelopathy, lumbar region: Secondary | ICD-10-CM

## 2014-05-25 DIAGNOSIS — E039 Hypothyroidism, unspecified: Secondary | ICD-10-CM

## 2014-05-25 LAB — MICROALBUMIN / CREATININE URINE RATIO
Creatinine,U: 114.5 mg/dL
Microalb Creat Ratio: 0.4 mg/g (ref 0.0–30.0)
Microalb, Ur: 0.5 mg/dL (ref 0.0–1.9)

## 2014-05-25 LAB — HEMOGLOBIN A1C: HEMOGLOBIN A1C: 6.2 % (ref 4.6–6.5)

## 2014-05-25 MED ORDER — HYDROCODONE-ACETAMINOPHEN 5-325 MG PO TABS: ORAL_TABLET | ORAL | Status: DC

## 2014-05-25 MED ORDER — LEVOTHYROXINE SODIUM 112 MCG PO TABS: 112.0000 ug | ORAL_TABLET | Freq: Every day | ORAL | Status: DC

## 2014-05-25 NOTE — Assessment & Plan Note (Addendum)
Will not see Dr. Darrick Huntsmanullo until Feb. Rx for norco # 60 no refills until she is seen by her PCP. Discussed this with patient. Pt Verbalized understanding. UDS at next visit?

## 2014-05-25 NOTE — Patient Instructions (Addendum)
Please visit lab before leaving today for your A1c and microalbumin.  Must be seen by Dr. Darrick Huntsmanullo (keep feb. Appointment) for more refills of any medications Happy Holidays!

## 2014-05-25 NOTE — Progress Notes (Signed)
Subjective:    Patient ID: Natasha LecherCarolyn B Melander, female    DOB: 1961/04/17, 53 y.o.   MRN: [id number]  HPI  Ms. Natasha Pitts is 53 yo female here today for medication refills.   1) Will get hemoglobin A1c and Microalbumin today as ordered by Dr. Darrick Huntsmanullo in November due to her previous blood work.   2) Hypothyroidism- Getting back on track with eating healthy. "Feels fine". Taking medication consistently, on empty stomach, and no changes to brand of medication.  Last TSH normal on 11/24 (0.90). Will refill today.   3) Chronic back/neck pain. Multiple back surgery and x 1 neck surgery. Has moderate pain most days. Left leg bothers her the most and occasionally swells, she reports, due to nerve damage. Would like refill of Norco for chronic back pain. Pt uses prn. Lasts her 3 months. She has annual PE appointment with her in Feb and could not get in sooner (per pt).   Review of Systems  Constitutional: Negative for fever, chills, diaphoresis, fatigue and unexpected weight change.  Cardiovascular: Negative for chest pain, palpitations and leg swelling.  Gastrointestinal: Negative for nausea, vomiting, abdominal pain, diarrhea and constipation.  Endocrine: Negative for cold intolerance, heat intolerance, polydipsia, polyphagia and polyuria.  Neurological: Negative for headaches.  Psychiatric/Behavioral: The patient is not nervous/anxious.    Past Medical History  Diagnosis Date  . Thyroid disease   . Migraines     improved with menopasue  . PONV (postoperative nausea and vomiting)   . Hypothyroidism   . Seasonal allergies   . Anxiety     History   Social History  . Marital Status: Divorced    Spouse Name: N/A    Number of Children: N/A  . Years of Education: N/A   Occupational History  . Not on file.   Social History Main Topics  . Smoking status: Never Smoker   . Smokeless tobacco: Never Used  . Alcohol Use: No  . Drug Use: No  . Sexual Activity: Not on file   Other Topics Concern   . Not on file   Social History Narrative    Past Surgical History  Procedure Laterality Date  . Spine surgery    . Abdominal hysterectomy  May 1995    and left oophorectomy  . Shoulder arthroscopy w/ capsular repair  2008    [facility], with CTS   . Cholecystectomy    . Nasal sinus surgery Bilateral   . Anterior cervical decomp/discectomy fusion N/A 09/27/2013    Procedure: ANTERIOR CERVICAL DECOMPRESSION/DISCECTOMY FUSION Cervical Six-Seven;  Surgeon: Temple PaciniHenry A Pool, MD;  Location: [facility];  Service: Neurosurgery;  Laterality: N/A;  ANTERIOR CERVICAL DECOMPRESSION/DISCECTOMY FUSION Cervical Six-Seven    Family History  Problem Relation Age of Onset  . Hyperlipidemia Mother   . Hypothyroidism Mother   . Hypothyroidism Father   . Hypothyroidism Sister   . Hypothyroidism Brother   . Hypothyroidism Son   . Heart disease Maternal Grandmother   . Heart disease Maternal Grandfather   . Diabetes Paternal Grandmother   . Stroke Paternal Grandmother   . Cancer Paternal Grandfather     lung    Allergies  Allergen Reactions  . Imitrex [Sumatriptan Base] Hives  . Latex Hives and Itching    Current Outpatient Prescriptions on File Prior to Visit  Medication Sig Dispense Refill  . estradiol (ESTRACE) 1 MG tablet Take 1 tablet (1 mg total) by mouth daily. 90 tablet 3  . meloxicam (MOBIC) 15 MG tablet TAKE  1 TABLET BY MOUTH DAILY. 90 tablet 1  . methocarbamol (ROBAXIN) 750 MG tablet Take 1 tablet by mouth 3 times daily. (Patient taking differently: Take 1 tablet by mouth 3 times daily prn) 90 tablet 6  . pantoprazole (PROTONIX) 40 MG tablet Take 1 tablet (40 mg total) by mouth 2 (two) times daily. 60 tablet 11  . rizatriptan (MAXALT) 10 MG tablet Take 10 mg by mouth as needed for migraine. May repeat in 2 hours if needed    . venlafaxine XR (EFFEXOR-XR) 75 MG 24 hr capsule Take 1 capsule (75 mg total) by mouth daily. 90 capsule 2   No current facility-administered medications on file  prior to visit.       Objective:   Physical Exam  Constitutional: She is oriented to person, place, and time. She appears well-developed and well-nourished. No distress.  HENT:  Head: Normocephalic and atraumatic.  Neck: Neck supple. No thyromegaly present.  ACDF by Dr. Jordan LikesPool 2015  Cardiovascular: Normal rate, regular rhythm and normal heart sounds.   Abdominal: Soft. She exhibits no distension. There is no tenderness. There is no guarding.  Lymphadenopathy:    She has no cervical adenopathy.  Neurological: She is alert and oriented to person, place, and time. Coordination normal.  Skin: Skin is warm and dry. No rash noted. She is not diaphoretic. No erythema. No pallor.  Psychiatric: She has a normal mood and affect. Her behavior is normal. Judgment and thought content normal.   BP 110/62 mmHg  Pulse 70  Resp 14  Ht 4\' 11"  (1.499 m)  Wt 175 lb 12 oz (79.72 kg)  BMI 35.48 kg/m2  SpO2 98%      Assessment & Plan:

## 2014-05-25 NOTE — Assessment & Plan Note (Signed)
Stable. Patient went to lab for A1c and microalbumin as ordered by Dr. Darrick Huntsmanullo in Nov. Completed today.

## 2014-05-25 NOTE — Assessment & Plan Note (Addendum)
TSH normal 05/16/14. Refilled levothyroxine with 2 refills to get her to her next appointment with Dr. Darrick Huntsmanullo.

## 2014-05-25 NOTE — Progress Notes (Signed)
Pre visit review using our clinic review tool, if applicable. No additional management support is needed unless otherwise documented below in the visit note. 

## 2014-07-03 ENCOUNTER — Other Ambulatory Visit: Payer: Self-pay | Admitting: Internal Medicine

## 2014-07-03 NOTE — Telephone Encounter (Signed)
Last visit 07/15/13, ok refill? 

## 2014-07-17 ENCOUNTER — Ambulatory Visit: Payer: Self-pay | Admitting: Internal Medicine

## 2014-07-17 LAB — HM MAMMOGRAPHY: HM Mammogram: NEGATIVE

## 2014-07-18 ENCOUNTER — Encounter: Payer: Self-pay | Admitting: *Deleted

## 2014-07-21 ENCOUNTER — Encounter: Payer: Self-pay | Admitting: Internal Medicine

## 2014-07-24 ENCOUNTER — Other Ambulatory Visit: Payer: Self-pay | Admitting: Nurse Practitioner

## 2014-07-24 MED ORDER — HYDROCODONE-ACETAMINOPHEN 5-325 MG PO TABS: ORAL_TABLET | ORAL | Status: DC

## 2014-07-24 NOTE — Telephone Encounter (Signed)
Last visit 05/25/14 with Natasha Pitts

## 2014-07-24 NOTE — Telephone Encounter (Signed)
Ok to refill,  printed rx  

## 2014-07-25 NOTE — Telephone Encounter (Signed)
rx faxed

## 2014-07-31 ENCOUNTER — Ambulatory Visit (INDEPENDENT_AMBULATORY_CARE_PROVIDER_SITE_OTHER): Payer: 59 | Admitting: Internal Medicine

## 2014-07-31 ENCOUNTER — Encounter: Payer: Self-pay | Admitting: Internal Medicine

## 2014-07-31 VITALS — BP 122/78 | HR 71 | Temp 97.6°F | Resp 16 | Ht <= 58 in | Wt 178.0 lb

## 2014-07-31 DIAGNOSIS — E034 Atrophy of thyroid (acquired): Secondary | ICD-10-CM

## 2014-07-31 DIAGNOSIS — Z0000 Encounter for general adult medical examination without abnormal findings: Secondary | ICD-10-CM

## 2014-07-31 DIAGNOSIS — M5106 Intervertebral disc disorders with myelopathy, lumbar region: Secondary | ICD-10-CM

## 2014-07-31 DIAGNOSIS — M4722 Other spondylosis with radiculopathy, cervical region: Secondary | ICD-10-CM

## 2014-07-31 DIAGNOSIS — Z780 Asymptomatic menopausal state: Secondary | ICD-10-CM

## 2014-07-31 DIAGNOSIS — E669 Obesity, unspecified: Secondary | ICD-10-CM

## 2014-07-31 DIAGNOSIS — E119 Type 2 diabetes mellitus without complications: Secondary | ICD-10-CM

## 2014-07-31 DIAGNOSIS — Z23 Encounter for immunization: Secondary | ICD-10-CM

## 2014-07-31 LAB — MICROALBUMIN / CREATININE URINE RATIO
CREATININE, U: 194.5 mg/dL
Microalb Creat Ratio: 0.9 mg/g (ref 0.0–30.0)
Microalb, Ur: 1.7 mg/dL (ref 0.0–1.9)

## 2014-07-31 LAB — HM DIABETES FOOT EXAM

## 2014-07-31 MED ORDER — PANTOPRAZOLE SODIUM 40 MG PO TBEC: 40.0000 mg | DELAYED_RELEASE_TABLET | Freq: Two times a day (BID) | ORAL | Status: DC

## 2014-07-31 MED ORDER — VENLAFAXINE HCL ER 75 MG PO CP24: 75.0000 mg | ORAL_CAPSULE | Freq: Every day | ORAL | Status: DC

## 2014-07-31 MED ORDER — HYDROCODONE-ACETAMINOPHEN 5-325 MG PO TABS: ORAL_TABLET | ORAL | Status: DC

## 2014-07-31 NOTE — Progress Notes (Signed)
Patient ID: Natasha Pitts, female   DOB: 02-25-1961, 54 y.o.   MRN: [id number]   Subjective:     Natasha HooverCarolyn Dean Pitts is a 54 y.o. female and is here for a comprehensive physical exam and  follow up on chronic medical issues including obesity,  Sciatica, GERD and new onset Type 2 DM.  She continues to have daily sciatica on the left.  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  She is controlling her pain with one  NORCO tablet at night for pain radiating to left thigh that prevents her from resting well .  She denies constipation, foot drop, and leg weakness,   She has a history of GERD and on recent endoscopy had an IRREGULAR Z LINE so   PROTONIX BID REC BY [facility]IOTT.  Under current regimen she is asymptomatic unless she eats too late or too much.    She has not addressed her new onset DM complicated by obesity with diet or exercise yet and has gained 3 lbs since her last visit. 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     History   Social History  . Marital Status: Divorced    Spouse Name: N/A    Number of Children: N/A  . Years of Education: N/A   Occupational History  . Not on file.   Social History Main Topics  . Smoking status: Never Smoker   . Smokeless tobacco: Never Used  . Alcohol Use: No  . Drug Use: No  . Sexual Activity: Not on file   Other Topics Concern  . Not on file   Social History Narrative   Health Maintenance  Topic Date Due  . FOOT EXAM  09/08/1970  . OPHTHALMOLOGY EXAM  09/08/1970  . PAP SMEAR  12/01/2013  . HEMOGLOBIN A1C  11/24/2014  . INFLUENZA VACCINE  01/22/2015  . MAMMOGRAM  07/18/2015  . URINE MICROALBUMIN  08/01/2015  . TETANUS/TDAP  07/28/2017  . PNEUMOCOCCAL POLYSACCHARIDE VACCINE (2) 08/01/2019  . COLONOSCOPY  01/19/2021    The following portions of the patient's history were reviewed and updated as appropriate: allergies, current medications, past  family history, past medical history, past social history, past surgical history and problem list.  Review of Systems A comprehensive review of systems was negative.   Objective:   BP 122/78 mmHg  Pulse 71  Temp(Src) 97.6 F (36.4 C) (Oral)  Resp 16  Ht 4\' 10"  (1.473 m)  Wt 178 lb (80.74 kg)  BMI 37.21 kg/m2  SpO2 97%  General appearance: obese , alert, cooperative and appears stated age Head: Normocephalic, without obvious abnormality, atraumatic Eyes: conjunctivae/corneas clear. PERRL, EOM's intact. Fundi benign. Ears: normal TM's and external ear canals both ears Nose: Nares normal. Septum midline. Mucosa normal. No drainage or sinus tenderness. Throat: lips, mucosa, and tongue normal; teeth and gums normal Neck: no adenopathy, no carotid bruit, no JVD, supple, symmetrical, trachea midline and thyroid not enlarged, symmetric, no tenderness/mass/nodules Lungs: clear to auscultation bilaterally Breasts: normal appearance, no masses or tenderness Heart: regular rate and rhythm, S1, S2 normal, no murmur, click, rub or gallop Abdomen: soft, non-tender; bowel sounds normal; no masses,  no organomegaly Extremities: extremities normal, atraumatic, no cyanosis or edema Pulses: 2+ and symmetric Skin: Skin color, texture, turgor normal. No rashes or lesions Neurologic: Alert and oriented X 3, normal strength and tone. Normal symmetric reflexes. Normal coordination  and gait.   .    Assessment and Plan:    Problem List Items Addressed This Visit    Obesity (BMI 30-39.9)    Now complicated by Type 2 DM not requiring medication (yet) and continued weight gain. I have addressed  BMI and recommended a low glycemic index diet utilizing smaller more frequent meals to increase metabolism.  I have also recommended that patient start exercising with a goal of 30 minutes of aerobic exercise a minimum of 5 days per week. e done today.  Wt Readings from Last 3 Encounters:  07/31/14 178 lb (80.74  kg)  05/25/14 175 lb 12 oz (79.72 kg)  07/15/13 176 lb (79.833 kg)         Lumbar disc disorder with myelopathy    Managed with one Norco daily on most days,  Taken in the evening.  Did not tolerate gabapentin due to sedating effects   Drug contract signed, refills given.  Advised to lose weight.   No plans for surgery       Hypothyroidism    Lab Results  Component Value Date   TSH 0.90 05/16/2014   Thyroid function is WNL on current dose.  No current changes needed.       Diabetes mellitus without complication    Diagnosed in Nov with fasting glucose 148 . She does not require medications , but she is not following a low GI diet or exercising,  Counselling given,  Printed diet given..  Reminder for annual eye exam made.  Lab Results  Component Value Date   HGBA1C 6.2 05/25/2014   Lab Results  Component Value Date   MICROALBUR 1.7 07/31/2014         Relevant Orders   Microalbumin / creatinine urine ratio (Completed)   Cervical spondylosis with radiculopathy    S/p decompression April 2013 for pain accompanied by numbness involving  left arm. 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       Relevant Medications   HYDROcodone-acetaminophen (NORCO/VICODIN) 5-325 MG per tablet    Other Visit Diagnoses    Menopause    -  Primary    Relevant Medications    venlafaxine (EFFEXOR-XR) 24 hr capsule    Need for prophylactic vaccination against Streptococcus pneumoniae (pneumococcus)        Relevant Orders    Pneumococcal polysaccharide vaccine 23-valent greater than or equal to 2yo subcutaneous/IM (Completed)

## 2014-07-31 NOTE — Patient Instructions (Addendum)
Your A1c is 6.2.  This means that although you have diabetes, you do not need medications currently, but should have follow up with me every 3 to 6 months for monitoring.   start exercising regularly, eliminate unnecessary starches from your diet (read about low glycemic diets), and lose weight .  These 3 things may prevent the progression of DM and the  need for medications,     This is  MY VERSION of a  "Low GI"  Weight loss Diet.  It is appropriate for all patients with normal renal function , gluten tolerance, and advised for patients who have prediabetes or diabetes:   All of the foods can be found at grocery stores and in bulk at [employer].  The Atkins protein bars and shakes are available in more varieties at [employer], [employer] and [place].     7 AM Breakfast:  Choose from the following:  < 5 carbs  Weekdays: Low carbohydrate Protein  Shakes (EAS AdvantEdge "Carb  Control" shakes, Atkins,  Muscle Milk or Premier Protein shakes)     Weekends:  a scrambled egg/bacon/cheese burrito made with Mission's "carb  balance" whole wheat tortilla  (about 10 net carbs )  Eggs,  bacon /sausage , Joseph's pita /lavash bread or  (5 carbs)  A slice of fritatta ( egg based baked dish, no  crust:  google it) (< 10 carbs)   Avoid cereal and bananas, oatmeal and cream of wheat and grits. They are loaded with carbohydrates!   10 AM: high protein snack  (< 5 carbs)   Protein bar by Atkins  Or KIND  (the snack size, < 200 cal, usually < 6 carbs    A stick of cheese:  Around 1 carb,  100 cal      Other so called "protein bars" tend to be loaded with carbohydrates.  Remember, in food advertising, the word "energy" is synonymous for " carbohydrate."  Lunch:   A Sandwich using the bread choices listed, Can use any  Eggs,  lunchmeat, grilled meat or canned tuna).  Can add avocado, regular mayo/mustard  and cheese.  A Salad using blue cheese, ranch,  Goddess dressing  or vinagrette,  No croutons or "confetti"  and no "candied nuts" but regular nuts OK.   2 HARD BOILED EGG WHITES AND A CUP OF one of these greek yogurts:    dannon lt n fit greek yogurt         chobani 100 greek yogurt,    Oikos triple zero greek yogurt       No pretzels or chips.  Pickles and miniature sweet peppers are a good low  carb alternative that provide a "crunch"  The bread is the only source of carbohydrate in a sandwich and  can be decreased by trying some of these alternatives to traditional loaf bread:   Joseph's pita bread and Lavash (flat) bread :  50 cal and 4 net carbs  available at [employer]Js and [employer].  Taste better when toasted, use as pita chips  [employer] makes a variety of  flatbreads and  A PITA POCKET.    LOOK FOR  THE ONES THAT ARE 17 NET CARBS OR LESS    Mission makes 2 sizes of  Low carb whole wheat tortillas  (The large one is  210 cal and 6 net carbs)   Avoid "Low fat dressings, as well as Barry Brunner and [employer] dressings    3 PM/ Mid day  Snack:  Consider  1 ounce of  almonds, walnuts, pistachios, pecans, peanuts,  Macadamia nuts or a nut medley that does not contain raisins or cranberries.  No "granola"; the dried cranberries and raisins are loaded with carbohydrates. Mixed nuts as long as there are no raisins,  cranberries or dried fruit.    Try the prosciutto/mozzarella cheese sticks by [employer]  In deli /backery section   High protein   To avoid overindulging in snacks: Try drinking a glass of unsweeted almond/coconut milk  Or a cup of coffee with your Atkins chocolate bar to keep you from having 3!!!   Pork rinds!  Yes Pork Rinds are low carb potato chip substitute!   Toasted Joseph's flatbread with hummous dip (chickpeas)       6 PM  Dinner:     Meat/fowl/fish with a green salad, and either broccoli, cauliflower, green beans, spinach, brussel sprouts, bok choy or  Lima beans. Fried in canola oil /olive oil BUT DO NOT BREAD THE PROTEIN!!      There is a low carb pasta by Dreamfield's that is  acceptable and tastes great: only 5 digestible carbs/serving.( All grocery stores but BJs carry it )  Prepared Meals:  Try Hurley Cisco Angelo's chicken piccata or chicken or eggplant parm over low carb pasta.(Lowes and BJs)   Marjory Lies Sanchez's "Carnitas" (pulled pork, no sauce,  0 carbs) or his beef pot roast to make a dinner burrito (at [employer])  Barbecue with cole slaw is low carb BUT NO BUN!  SAME WITH HAMBURGERS     Whole wheat pasta is still full of digestible carbs and  Not as low in glycemic index as Dreamfield's.   Brown rice is still rice,  So skip the rice and noodles if you eat [place] or [place] (or at least limit to 1/2 cup)  9 PM snack :   Breyer's "low carb" fudgsicle or  ice cream bar (Carb Smart line), or  Weight  Watcher's ice cream bar , or another "no sugar added" ice cream;  a serving of fresh berries/cherries with whipped cream   Cheese or greek yogurt   8 ounces of [place] unsweetened almond/cococunut milk  Cheese and crackers (using WASA crackers,  They are low carb) or peanut butter on low carb crackers or pita bread     Avoid bananas, pineapple, grapes  and watermelon on a regular basis because they are high in sugar.  THINK OF THEM AS DESSERT and do not have daily   Remember that snack Substitutions should be less than 10 NET carbs per serving and meals should be < 20 net carbs. Remember that carbohydrates from fiber do not affect blood sugar, so you can  subtract fiber grams to get the "net carbs " of any particular food item.   Health Maintenance Adopting a healthy lifestyle and getting preventive care can go a long way to promote health and wellness. Talk with your health care provider about what schedule of regular examinations is right for you. This is a good chance for you to check in with your provider about disease prevention and staying healthy. In between checkups, there are plenty of things you can do on your own. Experts have done a lot of research about which  lifestyle changes and preventive measures are most likely to keep you healthy. Ask your health care provider for more information. WEIGHT AND DIET  Eat a healthy diet  Be sure to include plenty of vegetables, fruits, low-fat dairy products, and lean  protein.  Do not eat a lot of foods high in solid fats, added sugars, or salt.  Get regular exercise. This is one of the most important things you can do for your health.  Most adults should exercise for at least 150 minutes each week. The exercise should increase your heart rate and make you sweat (moderate-intensity exercise).  Most adults should also do strengthening exercises at least twice a week. This is in addition to the moderate-intensity exercise.  Maintain a healthy weight  Body mass index (BMI) is a measurement that can be used to identify possible weight problems. It estimates body fat based on height and weight. Your health care provider can help determine your BMI and help you achieve or maintain a healthy weight.  For females 32 years of age and older:   A BMI below 18.5 is considered underweight.  A BMI of 18.5 to 24.9 is normal.  A BMI of 25 to 29.9 is considered overweight.  A BMI of 30 and above is considered obese.  Watch levels of cholesterol and blood lipids  You should start having your blood tested for lipids and cholesterol at 54 years of age, then have this test every 5 years.  You may need to have your cholesterol levels checked more often if:  Your lipid or cholesterol levels are high.  You are older than 54 years of age.  You are at high risk for heart disease.  CANCER SCREENING   Lung Cancer  Lung cancer screening is recommended for adults 81-63 years old who are at high risk for lung cancer because of a history of smoking.  A yearly low-dose CT scan of the lungs is recommended for people who:  Currently smoke.  Have quit within the past 15 years.  Have at least a 30-pack-year history of  smoking. A pack year is smoking an average of one pack of cigarettes a day for 1 year.  Yearly screening should continue until it has been 15 years since you quit.  Yearly screening should stop if you develop a health problem that would prevent you from having lung cancer treatment.  Breast Cancer  Practice breast self-awareness. This means understanding how your breasts normally appear and feel.  It also means doing regular breast self-exams. Let your health care provider know about any changes, no matter how small.  If you are in your 20s or 30s, you should have a clinical breast exam (CBE) by a health care provider every 1-3 years as part of a regular health exam.  If you are 12 or older, have a CBE every year. Also consider having a breast X-ray (mammogram) every year.  If you have a family history of breast cancer, talk to your health care provider about genetic screening.  If you are at high risk for breast cancer, talk to your health care provider about having an MRI and a mammogram every year.  Breast cancer gene (BRCA) assessment is recommended for women who have family members with BRCA-related cancers. BRCA-related cancers include:  Breast.  Ovarian.  Tubal.  Peritoneal cancers.  Results of the assessment will determine the need for genetic counseling and BRCA1 and BRCA2 testing. Cervical Cancer Routine pelvic examinations to screen for cervical cancer are no longer recommended for nonpregnant women who are considered low risk for cancer of the pelvic organs (ovaries, uterus, and vagina) and who do not have symptoms. A pelvic examination may be necessary if you have symptoms including those associated with pelvic  infections. Ask your health care provider if a screening pelvic exam is right for you.   The Pap test is the screening test for cervical cancer for women who are considered at risk.  If you had a hysterectomy for a problem that was not cancer or a condition  that could lead to cancer, then you no longer need Pap tests.  If you are older than 65 years, and you have had normal Pap tests for the past 10 years, you no longer need to have Pap tests.  If you have had past treatment for cervical cancer or a condition that could lead to cancer, you need Pap tests and screening for cancer for at least 20 years after your treatment.  If you no longer get a Pap test, assess your risk factors if they change (such as having a new sexual partner). This can affect whether you should start being screened again.  Some women have medical problems that increase their chance of getting cervical cancer. If this is the case for you, your health care provider may recommend more frequent screening and Pap tests.  The human papillomavirus (HPV) test is another test that may be used for cervical cancer screening. The HPV test looks for the virus that can cause cell changes in the cervix. The cells collected during the Pap test can be tested for HPV.  The HPV test can be used to screen women 46 years of age and older. Getting tested for HPV can extend the interval between normal Pap tests from three to five years.  An HPV test also should be used to screen women of any age who have unclear Pap test results.  After 54 years of age, women should have HPV testing as often as Pap tests.  Colorectal Cancer  This type of cancer can be detected and often prevented.  Routine colorectal cancer screening usually begins at 54 years of age and continues through 54 years of age.  Your health care provider may recommend screening at an earlier age if you have risk factors for colon cancer.  Your health care provider may also recommend using home test kits to check for hidden blood in the stool.  A small camera at the end of a tube can be used to examine your colon directly (sigmoidoscopy or colonoscopy). This is done to check for the earliest forms of colorectal cancer.  Routine  screening usually begins at age 52.  Direct examination of the colon should be repeated every 5-10 years through 54 years of age. However, you may need to be screened more often if early forms of precancerous polyps or small growths are found. Skin Cancer  Check your skin from head to toe regularly.  Tell your health care provider about any new moles or changes in moles, especially if there is a change in a mole's shape or color.  Also tell your health care provider if you have a mole that is larger than the size of a pencil eraser.  Always use sunscreen. Apply sunscreen liberally and repeatedly throughout the day.  Protect yourself by wearing long sleeves, pants, a wide-brimmed hat, and sunglasses whenever you are outside. HEART DISEASE, DIABETES, AND HIGH BLOOD PRESSURE   Have your blood pressure checked at least every 1-2 years. High blood pressure causes heart disease and increases the risk of stroke.  If you are between 44 years and 53 years old, ask your health care provider if you should take aspirin to prevent strokes.  Have regular diabetes screenings. This involves taking a blood sample to check your fasting blood sugar level.  If you are at a normal weight and have a low risk for diabetes, have this test once every three years after 54 years of age.  If you are overweight and have a high risk for diabetes, consider being tested at a younger age or more often. PREVENTING INFECTION  Hepatitis B  If you have a higher risk for hepatitis B, you should be screened for this virus. You are considered at high risk for hepatitis B if:  You were born in a country where hepatitis B is common. Ask your health care provider which countries are considered high risk.  Your parents were born in a high-risk country, and you have not been immunized against hepatitis B (hepatitis B vaccine).  You have HIV or AIDS.  You use needles to inject street drugs.  You live with someone who has  hepatitis B.  You have had sex with someone who has hepatitis B.  You get hemodialysis treatment.  You take certain medicines for conditions, including cancer, organ transplantation, and autoimmune conditions. Hepatitis C  Blood testing is recommended for:  Everyone born from [birth year] through 1965.  Anyone with known risk factors for hepatitis C. Sexually transmitted infections (STIs)  You should be screened for sexually transmitted infections (STIs) including gonorrhea and chlamydia if:  You are sexually active and are younger than 54 years of age.  You are older than 54 years of age and your health care provider tells you that you are at risk for this type of infection.  Your sexual activity has changed since you were last screened and you are at an increased risk for chlamydia or gonorrhea. Ask your health care provider if you are at risk.  If you do not have HIV, but are at risk, it may be recommended that you take a prescription medicine daily to prevent HIV infection. This is called pre-exposure prophylaxis (PrEP). You are considered at risk if:  You are sexually active and do not regularly use condoms or know the HIV status of your partner(s).  You take drugs by injection.  You are sexually active with a partner who has HIV. Talk with your health care provider about whether you are at high risk of being infected with HIV. If you choose to begin PrEP, you should first be tested for HIV. You should then be tested every 3 months for as long as you are taking PrEP.  PREGNANCY   If you are premenopausal and you may become pregnant, ask your health care provider about preconception counseling.  If you may become pregnant, take 400 to 800 micrograms (mcg) of folic acid every day.  If you want to prevent pregnancy, talk to your health care provider about birth control (contraception). OSTEOPOROSIS AND MENOPAUSE   Osteoporosis is a disease in which the bones lose minerals and  strength with aging. This can result in serious bone fractures. Your risk for osteoporosis can be identified using a bone density scan.  If you are 31 years of age or older, or if you are at risk for osteoporosis and fractures, ask your health care provider if you should be screened.  Ask your health care provider whether you should take a calcium or vitamin D supplement to lower your risk for osteoporosis.  Menopause may have certain physical symptoms and risks.  Hormone replacement therapy may reduce some of these symptoms and risks. Talk to  your health care provider about whether hormone replacement therapy is right for you.  HOME CARE INSTRUCTIONS   Schedule regular health, dental, and eye exams.  Stay current with your immunizations.   Do not use any tobacco products including cigarettes, chewing tobacco, or electronic cigarettes.  If you are pregnant, do not drink alcohol.  If you are breastfeeding, limit how much and how often you drink alcohol.  Limit alcohol intake to no more than 1 drink per day for nonpregnant women. One drink equals 12 ounces of beer, 5 ounces of wine, or 1 ounces of hard liquor.  Do not use street drugs.  Do not share needles.  Ask your health care provider for help if you need support or information about quitting drugs.  Tell your health care provider if you often feel depressed.  Tell your health care provider if you have ever been abused or do not feel safe at home. Document Released: 12/23/2010 Document Revised: 10/24/2013 Document Reviewed: 05/11/2013 [facility] Patient Information 2015 [place], [place]. This information is not intended to replace advice given to you by your health care provider. Make sure you discuss any questions you have with your health care provider.

## 2014-07-31 NOTE — Progress Notes (Signed)
Pre-visit discussion using our clinic review tool. No additional management support is needed unless otherwise documented below in the visit note.  

## 2014-08-01 ENCOUNTER — Encounter: Payer: Self-pay | Admitting: Internal Medicine

## 2014-08-01 NOTE — Assessment & Plan Note (Addendum)
S/p decompression April 2015 for pain accompanied by numbness involving  left arm. Symptoms have resolved, except for occasional posterior neck pain aggravated by work ans an [employer].

## 2014-08-01 NOTE — Assessment & Plan Note (Signed)
Lab Results  Component Value Date   TSH 0.90 05/16/2014   Thyroid function is WNL on current dose.  No current changes needed.

## 2014-08-01 NOTE — Assessment & Plan Note (Addendum)
Managed with one Norco daily on most days,  Taken in the evening.  Did not tolerate gabapentin due to sedating effects   Drug contract signed, refills given.  Advised to lose weight.   No plans for surgery

## 2014-08-01 NOTE — Assessment & Plan Note (Signed)
Now complicated by Type 2 DM not requiring medication (yet) and continued weight gain. I have addressed  BMI and recommended a low glycemic index diet utilizing smaller more frequent meals to increase metabolism.  I have also recommended that patient start exercising with a goal of 30 minutes of aerobic exercise a minimum of 5 days per week. e done today.  Wt Readings from Last 3 Encounters:  07/31/14 178 lb (80.74 kg)  05/25/14 175 lb 12 oz (79.72 kg)  07/15/13 176 lb (79.833 kg)

## 2014-08-01 NOTE — Assessment & Plan Note (Signed)
Diagnosed in Nov with fasting glucose 148 . She does not require medications , but sShe is not following a low GI diet or exercising,  Counselling given,  Printed diet given..  Reminder for annual eye exam made.  Lab Results  Component Value Date   HGBA1C 6.2 05/25/2014   Lab Results  Component Value Date   MICROALBUR 1.7 07/31/2014

## 2014-09-13 NOTE — Telephone Encounter (Signed)
Mailed unread message to pt  

## 2014-09-24 ENCOUNTER — Other Ambulatory Visit: Payer: Self-pay | Admitting: Internal Medicine

## 2014-09-24 ENCOUNTER — Encounter: Payer: Self-pay | Admitting: Internal Medicine

## 2014-09-25 NOTE — Telephone Encounter (Signed)
Last visit and refill 07/31/14

## 2014-09-26 MED ORDER — HYDROCODONE-ACETAMINOPHEN 5-325 MG PO TABS: ORAL_TABLET | ORAL | Status: DC

## 2014-10-30 ENCOUNTER — Ambulatory Visit: Payer: 59 | Admitting: Internal Medicine

## 2014-11-11 ENCOUNTER — Ambulatory Visit
Admission: EM | Admit: 2014-11-11 | Discharge: 2014-11-11 | Disposition: A | Discharge status: Home / Self Care | Payer: 59

## 2014-11-11 NOTE — ED Notes (Signed)
Pt states she noticed a yellow discharge coming from her great right toe.

## 2014-11-27 ENCOUNTER — Other Ambulatory Visit: Payer: Self-pay | Admitting: Nurse Practitioner

## 2014-12-18 ENCOUNTER — Other Ambulatory Visit: Payer: Self-pay | Admitting: Internal Medicine

## 2014-12-18 MED ORDER — HYDROCODONE-ACETAMINOPHEN 5-325 MG PO TABS: 1.0000 | ORAL_TABLET | Freq: Every evening | ORAL | Status: DC | PRN

## 2014-12-28 ENCOUNTER — Other Ambulatory Visit: Payer: Self-pay | Admitting: Internal Medicine

## 2014-12-28 NOTE — Telephone Encounter (Signed)
Last OV 2.8.16, last refill 1.11.16.  Please advise refill

## 2014-12-28 NOTE — Telephone Encounter (Signed)
Ok to refill,  Refill sent  

## 2014-12-28 NOTE — Telephone Encounter (Signed)
Left message on pts VM that Rx has been sent

## 2015-05-18 ENCOUNTER — Other Ambulatory Visit: Payer: Self-pay | Admitting: Internal Medicine

## 2015-05-19 IMAGING — MG MM DIGITAL SCREENING BILAT W/ TOMO W/ CAD
8 of 12 series · 8 of 28 positions shown · non-contrast
Comparison: Previous exam(s).

CLINICAL DATA: Screening.

EXAM:
DIGITAL SCREENING BILATERAL MAMMOGRAM WITH 3D TOMO WITH CAD

[R MLO synth-2D]
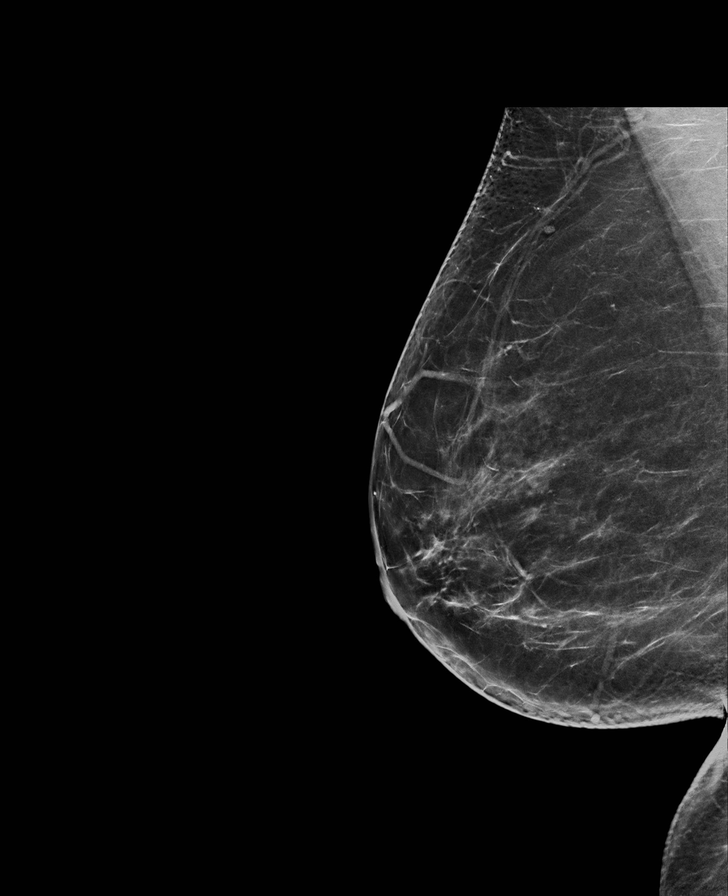

[L MLO]
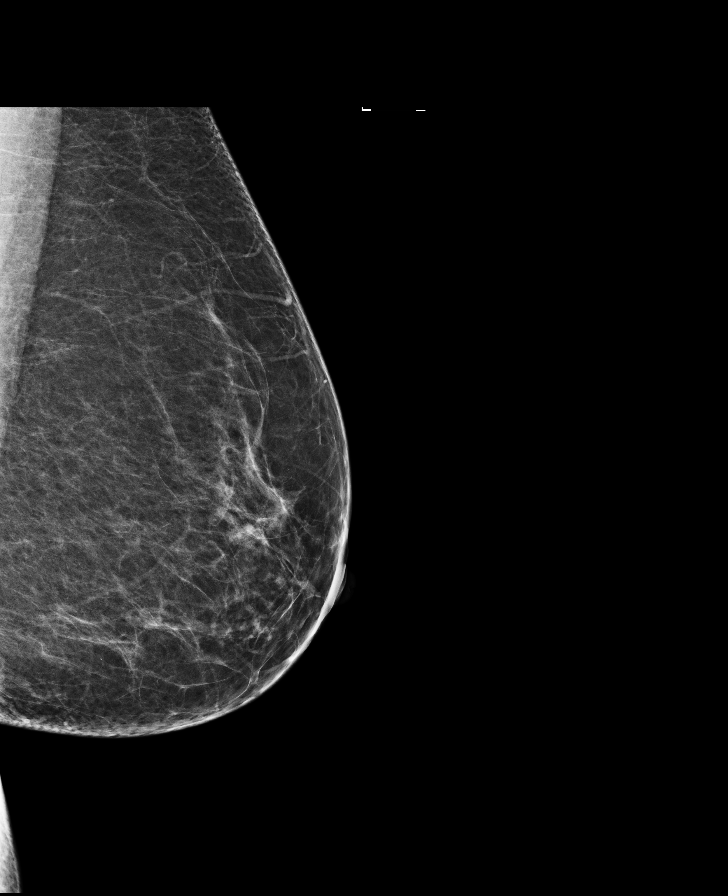

[R MLO]
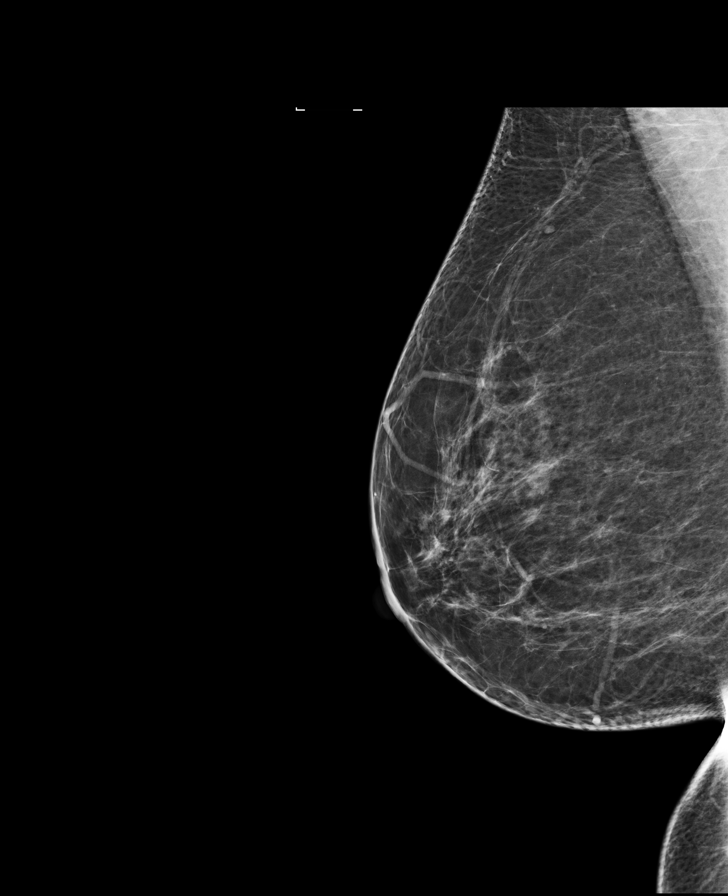

[R CC]
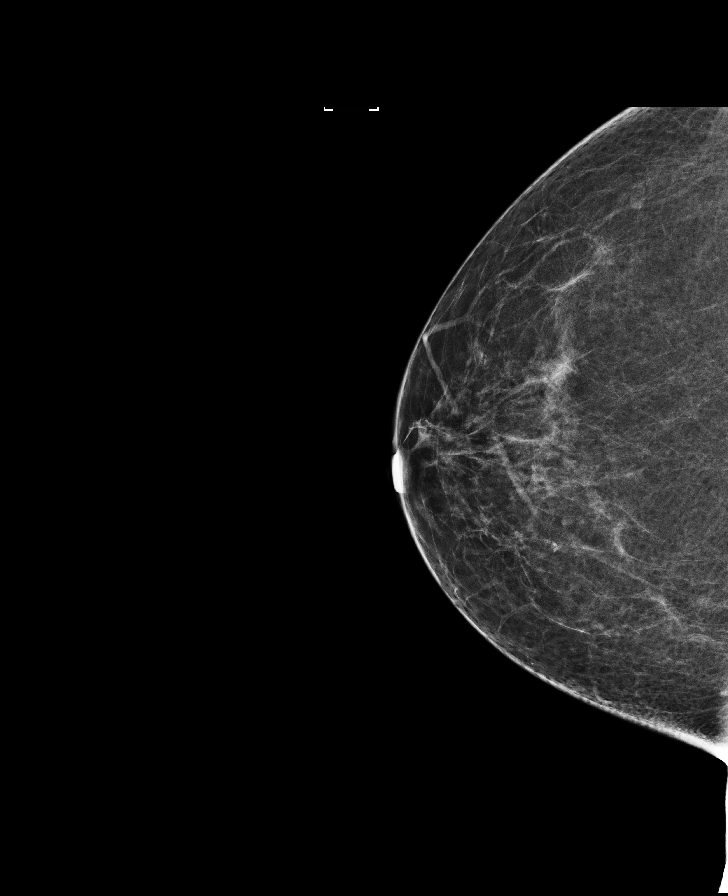

[R CC synth-2D]
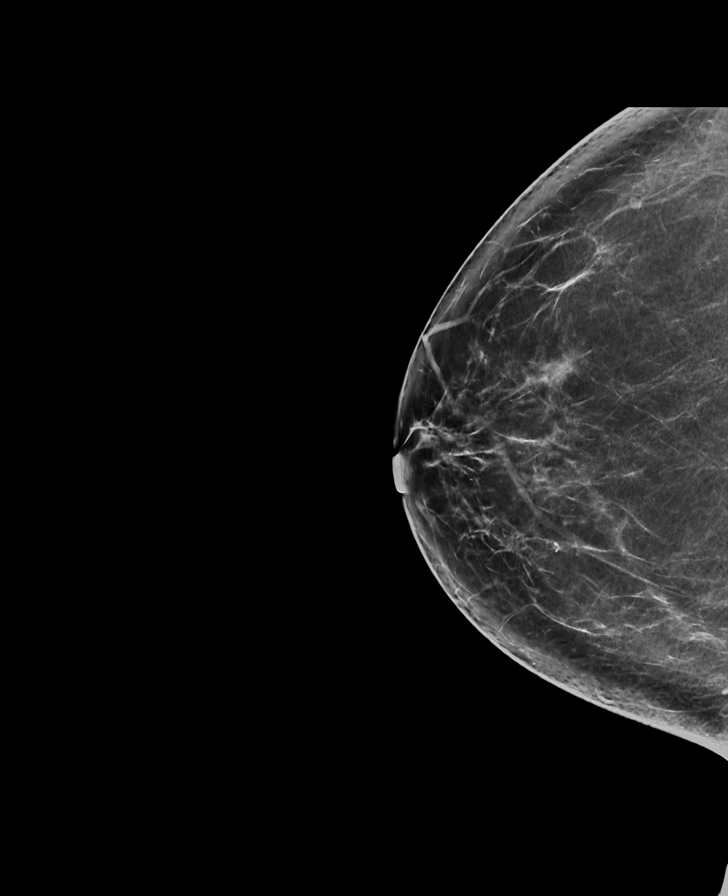

[L CC]
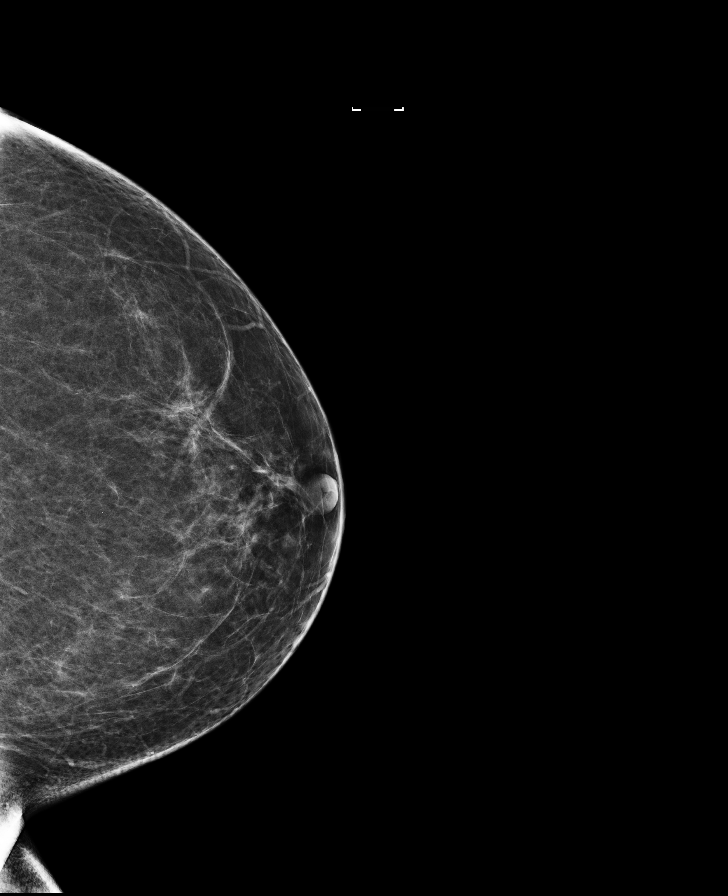

[L MLO synth-2D]
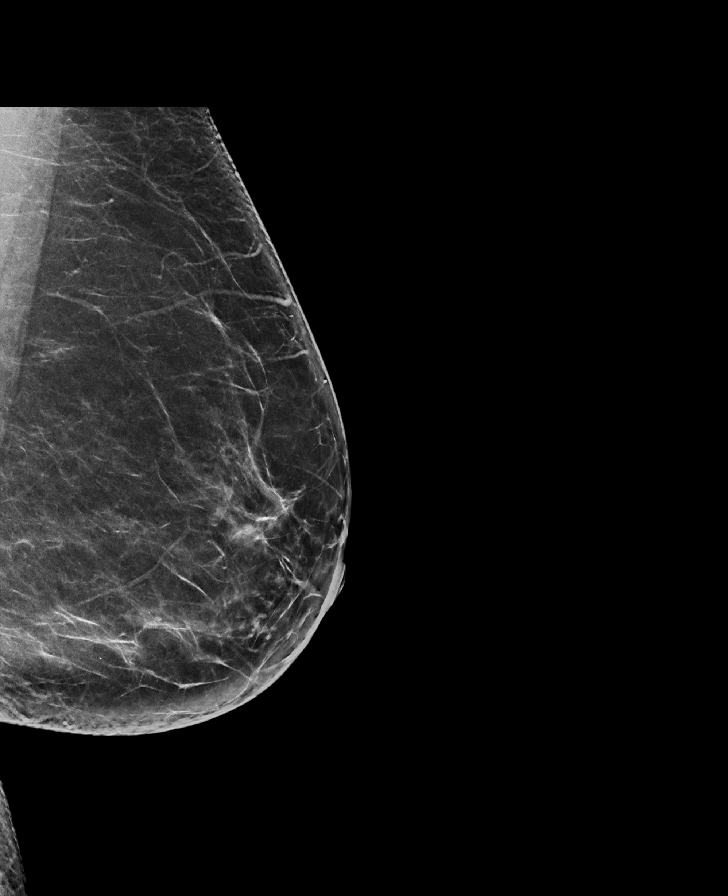

[L CC synth-2D]
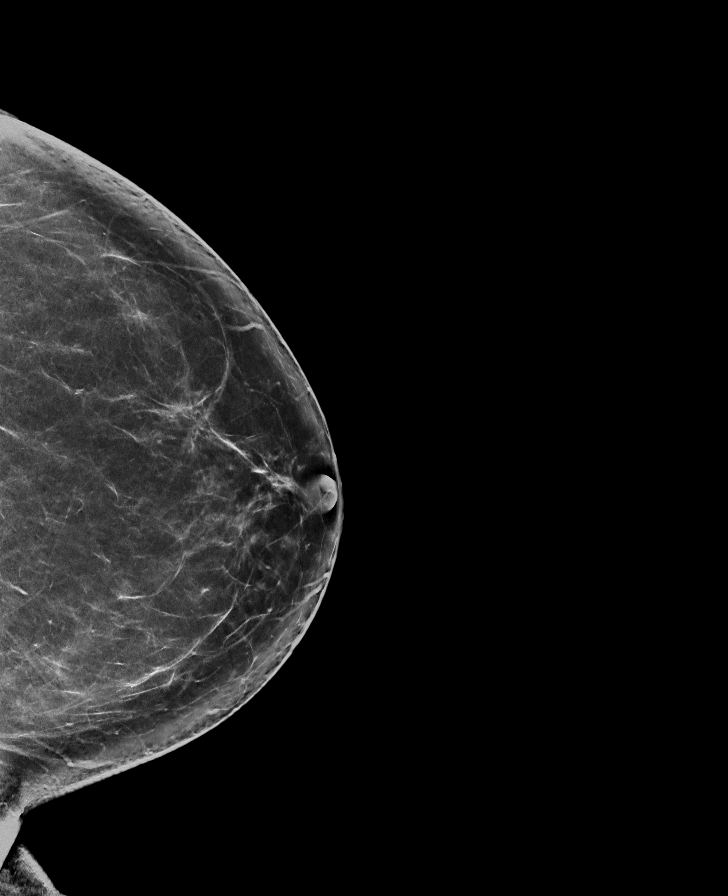

[8 of 28 positions shown; findings below may reference images not displayed]

ACR Breast Density Category b: There are scattered areas of
fibroglandular density.
FINDINGS: There are no findings suspicious for malignancy. Images were
processed with CAD.
IMPRESSION: No mammographic evidence of malignancy. A result letter of this
screening mammogram will be mailed directly to the patient.

RECOMMENDATION:
Screening mammogram in one year. (Code:[id number])

BI-RADS CATEGORY  1: Negative.

## 2015-05-22 ENCOUNTER — Other Ambulatory Visit: Payer: Self-pay

## 2015-05-22 DIAGNOSIS — Z780 Asymptomatic menopausal state: Secondary | ICD-10-CM

## 2015-05-22 MED ORDER — LEVOTHYROXINE SODIUM 112 MCG PO TABS: 112.0000 ug | ORAL_TABLET | Freq: Every day | ORAL | Status: DC

## 2015-05-22 MED ORDER — VENLAFAXINE HCL ER 75 MG PO CP24: 75.0000 mg | ORAL_CAPSULE | Freq: Every day | ORAL | Status: DC

## 2015-05-23 ENCOUNTER — Ambulatory Visit (INDEPENDENT_AMBULATORY_CARE_PROVIDER_SITE_OTHER): Payer: 59 | Admitting: Nurse Practitioner

## 2015-05-23 VITALS — BP 126/80 | HR 87 | Temp 98.2°F | Resp 16 | Ht <= 58 in | Wt 178.0 lb

## 2015-05-23 DIAGNOSIS — Z0000 Encounter for general adult medical examination without abnormal findings: Secondary | ICD-10-CM | POA: Diagnosis not present

## 2015-05-23 NOTE — Patient Instructions (Signed)

## 2015-05-23 NOTE — Progress Notes (Signed)
Pre visit review using our clinic review tool, if applicable. No additional management support is needed unless otherwise documented below in the visit note. 

## 2015-05-23 NOTE — Progress Notes (Signed)
Patient ID: Natasha HooverCarolyn Dean Pitts, female    DOB: 06/11/1961  Age: 54 y.o. MRN: [id number]  CC: Annual Exam   HPI Natasha Pitts presents for her annual physical exam.   1) Health Maintenance-   Diet- No change- has low carb diet info at home  Exercise- No change  Immunizations- UTD  Mammogram- Jan. 2017   Pap- Hysterectomy, denies Pelvic complaints today and wishes to defer exam  Colonoscopy- 2012   Eye Exam- 6/16   Dental Exam- Every 6 months   STD- No current reason for screening   History Natasha JonesCarolyn has a past medical history of Thyroid disease; Migraines; PONV (postoperative nausea and vomiting); Hypothyroidism; Seasonal allergies; and Anxiety.   She has past surgical history that includes Spine surgery; Abdominal hysterectomy (May 1995); Shoulder arthroscopy w/ capsular repair (2008); Cholecystectomy; Nasal sinus surgery (Bilateral); and Anterior cervical decomp/discectomy fusion (N/A, 09/27/2013).   Her family history includes Cancer in her paternal grandfather; Diabetes in her paternal grandmother; Heart disease in her maternal grandfather and maternal grandmother; Hyperlipidemia in her mother; Hypothyroidism in her brother, father, mother, sister, and son; Stroke in her paternal grandmother.She reports that she has never smoked. She has never used smokeless tobacco. She reports that she does not drink alcohol or use illicit drugs.  Outpatient Prescriptions Prior to Visit  Medication Sig Dispense Refill  . estradiol (ESTRACE) 1 MG tablet TAKE 1 TABLET BY MOUTH DAILY. 90 tablet 3  . HYDROcodone-acetaminophen (NORCO/VICODIN) 5-325 MG per tablet Take 1 tablet by mouth at bedtime as needed and may repeat dose one time if needed for moderate pain. Take 1-2 Tablets by mouth daily as needed for moderate pain. 60 tablet 0  . levothyroxine (SYNTHROID, LEVOTHROID) 112 MCG tablet Take 1 tablet (112 mcg total) by mouth daily. 30 tablet 2  . levothyroxine (SYNTHROID, LEVOTHROID) 112 MCG tablet  Take 1 tablet (112 mcg total) by mouth daily. 90 tablet 2  . meloxicam (MOBIC) 15 MG tablet TAKE 1 TABLET BY MOUTH DAILY. 90 tablet 1  . methocarbamol (ROBAXIN) 750 MG tablet Take 1 tablet by mouth 3 times daily. (Patient taking differently: Take 1 tablet by mouth 3 times daily prn) 90 tablet 6  . pantoprazole (PROTONIX) 40 MG tablet Take 1 tablet (40 mg total) by mouth 2 (two) times daily. 180 tablet 3  . venlafaxine XR (EFFEXOR-XR) 75 MG 24 hr capsule Take 1 capsule (75 mg total) by mouth daily. 90 capsule 0   No facility-administered medications prior to visit.    ROS Review of Systems  Constitutional: Negative for fever, chills, diaphoresis and fatigue.  Respiratory: Negative for chest tightness, shortness of breath and wheezing.   Cardiovascular: Negative for chest pain, palpitations and leg swelling.  Gastrointestinal: Negative for nausea, vomiting and diarrhea.  Skin: Negative for rash.  Neurological: Negative for dizziness, weakness, numbness and headaches.  Psychiatric/Behavioral: The patient is not nervous/anxious.     Objective:  BP 126/80 mmHg  Pulse 87  Temp(Src) 98.2 F (36.8 C)  Resp 16  Ht 4\' 10"  (1.473 m)  Wt 178 lb (80.74 kg)  BMI 37.21 kg/m2  SpO2 96%  Physical Exam  Constitutional: She is oriented to person, place, and time. She appears well-developed and well-nourished. No distress.  HENT:  Head: Normocephalic and atraumatic.  Right Ear: External ear normal.  Left Ear: External ear normal.  Nose: Nose normal.  Mouth/Throat: Oropharynx is clear and moist. No oropharyngeal exudate.  TMs and canals clear bilaterally  Eyes: Conjunctivae and EOM are  normal. Pupils are equal, round, and reactive to light. Right eye exhibits no discharge. Left eye exhibits no discharge. No scleral icterus.  Neck: Normal range of motion. Neck supple. No thyromegaly present.  Cardiovascular: Normal rate, regular rhythm, normal heart sounds and intact distal pulses.  Exam reveals  no gallop and no friction rub.   No murmur heard. Pulmonary/Chest: Effort normal and breath sounds normal. No respiratory distress. She has no wheezes. She has no rales. She exhibits no tenderness.  Breast exam performed with no significant findings  Abdominal: Soft. Bowel sounds are normal. She exhibits no distension and no mass. There is no tenderness. There is no rebound and no guarding.  Obese  Genitourinary:  Deferred due to no complaints   Musculoskeletal: Normal range of motion. She exhibits no edema or tenderness.  Lymphadenopathy:    She has no cervical adenopathy.  Neurological: She is alert and oriented to person, place, and time. She has normal reflexes. No cranial nerve deficit. She exhibits normal muscle tone. Coordination normal.  Skin: Skin is warm and dry. No rash noted. She is not diaphoretic. No erythema. No pallor.  Psychiatric: She has a normal mood and affect. Her behavior is normal. Judgment and thought content normal.   Assessment & Plan:   [name] was seen today for annual exam.  Diagnoses and all orders for this visit:  Routine general medical examination at a health care facility -     CBC with Differential/Platelet; Future -     Comprehensive metabolic panel; Future -     Hemoglobin A1c; Future -     Lipid panel; Future -     TSH; Future -     HIV antibody (with reflex); Future -     Hepatitis C Antibody; Future   I am having [name] maintain her methocarbamol, estradiol, pantoprazole, HYDROcodone-acetaminophen, meloxicam, venlafaxine XR, levothyroxine, and levothyroxine.  No orders of the defined types were placed in this encounter.     Follow-up: Return for Future lab appointment .

## 2015-05-24 ENCOUNTER — Other Ambulatory Visit: Payer: 59

## 2015-05-25 ENCOUNTER — Other Ambulatory Visit: Payer: 59

## 2015-05-27 ENCOUNTER — Encounter: Payer: Self-pay | Admitting: Nurse Practitioner

## 2015-05-27 DIAGNOSIS — Z0000 Encounter for general adult medical examination without abnormal findings: Secondary | ICD-10-CM | POA: Insufficient documentation

## 2015-05-27 NOTE — Assessment & Plan Note (Signed)
Discussed acute and chronic issues. Reviewed health maintenance measures, PFSHx, and immunizations. Obtain routine labs TSH, Lipid panel, CBC w/ diff, A1c, and CMET. Obtain once in a lifetime screening of Hep C due to age range and CDC recommendations as well as the HIV screen. Pt has no risk factors for other STD screenings.  No PAP due to hysterectomy and no vaginal complaints- deferred Pelvic today. Mammogram due in Jan. 2017.

## 2015-05-29 ENCOUNTER — Encounter: Payer: Self-pay | Admitting: Nurse Practitioner

## 2015-05-29 NOTE — Telephone Encounter (Signed)
Please advise 

## 2015-05-30 ENCOUNTER — Other Ambulatory Visit: Payer: Self-pay | Admitting: Internal Medicine

## 2015-06-27 ENCOUNTER — Telehealth: Payer: Self-pay | Admitting: *Deleted

## 2015-06-27 NOTE — Telephone Encounter (Signed)
Patient has requested to have labs drawn, for her tyroid. She had a physical on 05-23-15 and stated she was to have labs post physical.

## 2015-06-27 NOTE — Telephone Encounter (Signed)
Patient has active lab orders, just needs an appointment scheduled.  It will need to be fasting.  Thanks

## 2015-07-09 ENCOUNTER — Other Ambulatory Visit: Payer: 59

## 2015-07-09 ENCOUNTER — Other Ambulatory Visit (INDEPENDENT_AMBULATORY_CARE_PROVIDER_SITE_OTHER): Payer: BLUE CROSS/BLUE SHIELD

## 2015-07-09 ENCOUNTER — Telehealth: Payer: Self-pay | Admitting: *Deleted

## 2015-07-09 DIAGNOSIS — E034 Atrophy of thyroid (acquired): Secondary | ICD-10-CM

## 2015-07-09 DIAGNOSIS — E119 Type 2 diabetes mellitus without complications: Secondary | ICD-10-CM | POA: Diagnosis not present

## 2015-07-09 DIAGNOSIS — E038 Other specified hypothyroidism: Secondary | ICD-10-CM

## 2015-07-09 DIAGNOSIS — M4722 Other spondylosis with radiculopathy, cervical region: Secondary | ICD-10-CM

## 2015-07-09 LAB — HEMOGLOBIN A1C: Hgb A1c MFr Bld: 6 % (ref 4.6–6.5)

## 2015-07-09 LAB — COMPREHENSIVE METABOLIC PANEL
ALT: 16 U/L (ref 0–35)
AST: 15 U/L (ref 0–37)
Albumin: 4.3 g/dL (ref 3.5–5.2)
Alkaline Phosphatase: 74 U/L (ref 39–117)
BUN: 18 mg/dL (ref 6–23)
CO2: 23 meq/L (ref 19–32)
Calcium: 9.8 mg/dL (ref 8.4–10.5)
Chloride: 106 mEq/L (ref 96–112)
Creatinine, Ser: 0.78 mg/dL (ref 0.40–1.20)
GFR: 81.55 mL/min (ref >60.00)
GLUCOSE: 122 mg/dL — AB (ref 70–99)
POTASSIUM: 4.3 meq/L (ref 3.5–5.1)
SODIUM: 146 meq/L — AB (ref 135–145)
Total Bilirubin: 0.3 mg/dL (ref 0.2–1.2)
Total Protein: 7.2 g/dL (ref 6.0–8.3)

## 2015-07-09 LAB — LIPID PANEL
CHOLESTEROL: 215 mg/dL — AB (ref 0–200)
HDL: 57.9 mg/dL (ref >39.00)
LDL CALC: 133 mg/dL — AB (ref 0–99)
NonHDL: 157.41
TRIGLYCERIDES: 124 mg/dL (ref 0.0–149.0)
Total CHOL/HDL Ratio: 4
VLDL: 24.8 mg/dL (ref 0.0–40.0)

## 2015-07-09 LAB — TSH: TSH: 0.56 u[IU]/mL (ref 0.35–4.50)

## 2015-07-09 LAB — LDL CHOLESTEROL, DIRECT: Direct LDL: 143 mg/dL

## 2015-07-09 NOTE — Telephone Encounter (Signed)
Labs and dx?  

## 2015-07-11 ENCOUNTER — Encounter: Payer: Self-pay | Admitting: Internal Medicine

## 2015-07-11 DIAGNOSIS — E119 Type 2 diabetes mellitus without complications: Secondary | ICD-10-CM

## 2015-07-11 DIAGNOSIS — E785 Hyperlipidemia, unspecified: Secondary | ICD-10-CM

## 2015-07-11 DIAGNOSIS — Z780 Asymptomatic menopausal state: Secondary | ICD-10-CM

## 2015-07-11 DIAGNOSIS — E034 Atrophy of thyroid (acquired): Secondary | ICD-10-CM

## 2015-07-13 MED ORDER — ESTRADIOL 1 MG PO TABS: 1.0000 mg | ORAL_TABLET | Freq: Every day | ORAL | Status: DC

## 2015-07-13 MED ORDER — MELOXICAM 15 MG PO TABS: 15.0000 mg | ORAL_TABLET | Freq: Every day | ORAL | Status: DC

## 2015-07-13 MED ORDER — LEVOTHYROXINE SODIUM 112 MCG PO TABS: 112.0000 ug | ORAL_TABLET | Freq: Every day | ORAL | Status: DC

## 2015-07-13 MED ORDER — VENLAFAXINE HCL ER 75 MG PO CP24: 75.0000 mg | ORAL_CAPSULE | Freq: Every day | ORAL | Status: DC

## 2015-07-13 NOTE — Telephone Encounter (Signed)
Needs apt in June fasting labs prior to apt . Labs ordered

## 2015-07-13 NOTE — Addendum Note (Signed)
Addended by: Sherlene Shams on: 07/13/2015 12:57 PM   Modules accepted: Orders

## 2015-08-28 ENCOUNTER — Encounter: Payer: Self-pay | Admitting: Internal Medicine

## 2015-08-28 DIAGNOSIS — Z1239 Encounter for other screening for malignant neoplasm of breast: Secondary | ICD-10-CM

## 2015-09-28 LAB — HM MAMMOGRAPHY

## 2015-10-09 ENCOUNTER — Other Ambulatory Visit: Payer: Self-pay | Admitting: Internal Medicine

## 2015-10-15 ENCOUNTER — Ambulatory Visit: Payer: Self-pay | Attending: Internal Medicine

## 2015-11-09 ENCOUNTER — Encounter: Payer: Self-pay | Admitting: Internal Medicine

## 2016-01-21 ENCOUNTER — Ambulatory Visit: Payer: Self-pay | Admitting: Internal Medicine

## 2016-02-09 ENCOUNTER — Other Ambulatory Visit: Payer: Self-pay | Admitting: Internal Medicine

## 2016-02-11 ENCOUNTER — Other Ambulatory Visit: Payer: Self-pay | Admitting: Internal Medicine

## 2016-02-11 MED ORDER — MELOXICAM 15 MG PO TABS: 15.0000 mg | ORAL_TABLET | Freq: Every day | ORAL | 0 refills | Status: DC

## 2016-02-11 NOTE — Telephone Encounter (Signed)
Pt was last seen 05/23/2015. Pt was advised by Dr. Darrick Huntsmanullo to follow up in June. Pt did not have an OV in June; no follow up on file either. Advise on refill.

## 2016-03-12 ENCOUNTER — Other Ambulatory Visit: Payer: Self-pay | Admitting: Internal Medicine

## 2016-03-12 DIAGNOSIS — Z780 Asymptomatic menopausal state: Secondary | ICD-10-CM

## 2016-04-07 ENCOUNTER — Other Ambulatory Visit: Payer: Self-pay | Admitting: Internal Medicine

## 2016-07-01 ENCOUNTER — Other Ambulatory Visit: Payer: Self-pay | Admitting: Internal Medicine

## 2016-07-01 NOTE — Telephone Encounter (Signed)
Refilled 04/07/16. Pt last seen 05/23/15. You asked for pt to come in for an appt in June along with labs pt cancelled appt. Please advise?

## 2018-01-14 LAB — HM COLONOSCOPY

## 2019-06-01 ENCOUNTER — Telehealth: Payer: Self-pay | Admitting: *Deleted

## 2019-06-01 ENCOUNTER — Telehealth: Payer: Self-pay

## 2019-06-01 NOTE — Telephone Encounter (Signed)
Yes she can.

## 2019-06-01 NOTE — Telephone Encounter (Signed)
Copied from CRM #[id number]. Topic: Appointment Scheduling - New Patient >> Jun 01, 2019 12:58 PM Pitts, Natasha E wrote: New patient would like to re establish care and schedule for your office. Provider: Dr. Tullo Pt was a Pt of Dr. Tullo and her insurance was changed to [facility] but her insurance has now changed back to network that Dr. Tullo is apart of and Pt would like to get approval from Dr. Tullo to establish care with her again/ please advise    Route to department's PEC pool. 

## 2019-06-01 NOTE — Telephone Encounter (Signed)
Patient was a patient of yours previously but had to switch due to insurance. Would like to re-establish care with you.   Copied from [place] [id number]. Topic: Appointment Scheduling - New Patient >> Jun 01, 2019 12:58 PM Alanda Slim E wrote: New patient would like to re establish care and schedule for your office. Provider: Dr. Derrel Nip Pt was a Pt of Dr. Derrel Nip and her insurance was changed to [facility] but her insurance has now changed back to network that Dr. Derrel Nip is apart of and Pt would like to get approval from Dr. Derrel Nip to establish care with her again/ please advise    Route to department's PEC pool.

## 2019-06-01 NOTE — Telephone Encounter (Signed)
Yes she can re-establish

## 2019-06-01 NOTE — Telephone Encounter (Signed)
Can you schedule for a new pt appt.  

## 2019-06-02 NOTE — Telephone Encounter (Signed)
Copied from CRM #[id number]. Topic: Appointment Scheduling - New Patient >> Jun 01, 2019 12:58 PM Johnson, [name] wrote: New patient would like to re establish care and schedule for your office. Provider: Dr. Tullo Pt was a Pt of Dr. Tullo and her insurance was changed to [facility] but her insurance has now changed back to network that Dr. Tullo is apart of and Pt would like to get approval from Dr. Tullo to establish care with her again/ please advise    Route to department's PEC pool. 

## 2019-06-02 NOTE — Telephone Encounter (Signed)
Copied from [place] [id number]. Topic: Appointment Scheduling - New Patient >> Jun 01, 2019 12:58 PM Alanda Slim E wrote: New patient would like to re establish care and schedule for your office. Provider: Dr. Derrel Nip Pt was a Pt of Dr. Derrel Nip and her insurance was changed to [employer] but her insurance has now changed back to network that Dr. Derrel Nip is apart of and Pt would like to get approval from Dr. Derrel Nip to establish care with her again/ please advise    Route to department's PEC pool.

## 2019-06-10 ENCOUNTER — Ambulatory Visit: Payer: Managed Care, Other (non HMO) | Admitting: Internal Medicine

## 2019-09-07 ENCOUNTER — Other Ambulatory Visit: Payer: Self-pay | Admitting: Internal Medicine

## 2019-09-07 DIAGNOSIS — Z1231 Encounter for screening mammogram for malignant neoplasm of breast: Secondary | ICD-10-CM

## 2019-09-19 ENCOUNTER — Telehealth: Payer: Self-pay | Admitting: Internal Medicine

## 2019-12-07 ENCOUNTER — Encounter: Payer: Self-pay | Admitting: Internal Medicine

## 2019-12-07 ENCOUNTER — Ambulatory Visit (INDEPENDENT_AMBULATORY_CARE_PROVIDER_SITE_OTHER): Payer: BC Managed Care – PPO | Admitting: Internal Medicine

## 2019-12-07 ENCOUNTER — Other Ambulatory Visit: Payer: Self-pay

## 2019-12-07 VITALS — BP 102/70 | HR 79 | Temp 97.0°F | Resp 15 | Ht 59.0 in | Wt 175.4 lb

## 2019-12-07 DIAGNOSIS — E119 Type 2 diabetes mellitus without complications: Secondary | ICD-10-CM | POA: Diagnosis not present

## 2019-12-07 DIAGNOSIS — Z1211 Encounter for screening for malignant neoplasm of colon: Secondary | ICD-10-CM | POA: Diagnosis not present

## 2019-12-07 DIAGNOSIS — E034 Atrophy of thyroid (acquired): Secondary | ICD-10-CM

## 2019-12-07 DIAGNOSIS — E785 Hyperlipidemia, unspecified: Secondary | ICD-10-CM

## 2019-12-07 DIAGNOSIS — E1169 Type 2 diabetes mellitus with other specified complication: Secondary | ICD-10-CM | POA: Diagnosis not present

## 2019-12-07 DIAGNOSIS — E669 Obesity, unspecified: Secondary | ICD-10-CM

## 2019-12-07 LAB — COMPREHENSIVE METABOLIC PANEL
ALT: 20 U/L (ref 0–35)
AST: 16 U/L (ref 0–37)
Albumin: 4.3 g/dL (ref 3.5–5.2)
Alkaline Phosphatase: 73 U/L (ref 39–117)
BUN: 21 mg/dL (ref 6–23)
CO2: 31 mEq/L (ref 19–32)
Calcium: 9.5 mg/dL (ref 8.4–10.5)
Chloride: 102 mEq/L (ref 96–112)
Creatinine, Ser: 0.86 mg/dL (ref 0.40–1.20)
GFR: 67.48 mL/min (ref >60.00)
Glucose, Bld: 106 mg/dL — ABNORMAL HIGH (ref 70–99)
Potassium: 3.9 mEq/L (ref 3.5–5.1)
Sodium: 138 mEq/L (ref 135–145)
Total Bilirubin: 0.4 mg/dL (ref 0.2–1.2)
Total Protein: 7.1 g/dL (ref 6.0–8.3)

## 2019-12-07 LAB — TSH: TSH: 0.29 u[IU]/mL — ABNORMAL LOW (ref 0.35–4.50)

## 2019-12-07 LAB — LDL CHOLESTEROL, DIRECT: Direct LDL: 138 mg/dL

## 2019-12-07 LAB — MICROALBUMIN / CREATININE URINE RATIO
Creatinine,U: 41.6 mg/dL
Microalb Creat Ratio: 1.7 mg/g (ref 0.0–30.0)
Microalb, Ur: <0.7 mg/dL (ref 0.0–1.9)

## 2019-12-07 MED ORDER — FLUTICASONE-SALMETEROL 250-50 MCG/DOSE IN AEPB
1.0000 | INHALATION_SPRAY | Freq: Two times a day (BID) | RESPIRATORY_TRACT | 3 refills | Status: DC
Start: 2019-12-07 — End: 2020-04-18

## 2019-12-07 NOTE — Progress Notes (Signed)
Subjective:  Patient ID: Natasha Pitts, female    DOB: 06-Dec-1960  Age: 59 y.o. MRN: [id number]  CC: The primary encounter diagnosis was Hypothyroidism due to acquired atrophy of thyroid. Diagnoses of Diabetes mellitus without complication (HCC), Colon cancer screening, Hyperlipidemia associated with type 2 diabetes mellitus (HCC), and Obesity (BMI 30-39.9) were also pertinent to this visit.  HPI   Natasha Pitts presents for establishment of care.   She is a 59 yr old female with Type 2 DM diagnosed in 2015 , hypertension and obesity.    In May 2020 she recently underwent extraction of a renal calculi from her right ureteral calculus.  She continues to have intermittent ureteral pain.     Obesity:  Started [employer] today.  Has lost 3 lbs preparing for diet.  No longer stress eating.  Goal is 130 lbs .    History Pandora has a past medical history of Anxiety, Hypothyroidism, Migraines, PONV (postoperative nausea and vomiting), Seasonal allergies, and Thyroid disease.   She has a past surgical history that includes Spine surgery; Abdominal hysterectomy (May 1995); Shoulder arthroscopy w/ capsular repair (2008); Cholecystectomy; Nasal sinus surgery (Bilateral); and Anterior cervical decomp/discectomy fusion (N/A, 09/27/2013).   Her family history includes Cancer in her paternal grandfather; Diabetes in her paternal grandmother; Heart disease in her maternal grandfather and maternal grandmother; Hyperlipidemia in her mother; Hypothyroidism in her brother, father, mother, sister, and son; Stroke in her paternal grandmother.She reports that she has never smoked. She has never used smokeless tobacco. She reports that she does not drink alcohol and does not use drugs.  Outpatient Medications Prior to Visit  Medication Sig Dispense Refill  . Acetaminophen 500 MG capsule Take by mouth.    Marland Kitchen BREO ELLIPTA 100-25 MCG/INH AEPB Inhale 1 puff into the lungs daily.    . cholecalciferol  (VITAMIN D3) 25 MCG (1000 UNIT) tablet Take 1,000 Units by mouth daily.    Marland Kitchen lisinopril-hydrochlorothiazide (ZESTORETIC) 10-12.5 MG tablet Take 1 tablet by mouth daily.    . metFORMIN (GLUCOPHAGE-XR) 500 MG 24 hr tablet Take 500 mg by mouth 2 (two) times daily.    . methocarbamol (ROBAXIN) 750 MG tablet Take 1 tablet by mouth 3 times daily. (Patient taking differently: Take 1 tablet by mouth 3 times daily prn) 90 tablet 6  . pantoprazole (PROTONIX) 40 MG tablet TAKE 1 TABLET BY MOUTH 2 TIMES DAILY. 180 tablet 0  . triamcinolone cream (KENALOG) 0.1 % Apply 1 application topically 2 (two) times daily.    Marland Kitchen venlafaxine XR (EFFEXOR-XR) 150 MG 24 hr capsule Take 150 mg by mouth daily.    . vitamin B-12 (CYANOCOBALAMIN) 1000 MCG tablet Take 1,000 mcg by mouth daily.    Marland Kitchen levothyroxine (SYNTHROID) 100 MCG tablet Take 100 mcg by mouth daily.    Marland Kitchen estradiol (ESTRACE) 1 MG tablet Take 1 tablet (1 mg total) by mouth daily. 90 tablet 3  . HYDROcodone-acetaminophen (NORCO/VICODIN) 5-325 MG per tablet Take 1 tablet by mouth at bedtime as needed and may repeat dose one time if needed for moderate pain. Take 1-2 Tablets by mouth daily as needed for moderate pain. 60 tablet 0  . levothyroxine (SYNTHROID, LEVOTHROID) 112 MCG tablet Take 1 tablet (112 mcg total) by mouth daily. 30 tablet 2  . levothyroxine (SYNTHROID, LEVOTHROID) 112 MCG tablet TAKE 1 TABLET(112 MCG) BY MOUTH DAILY 90 tablet 0  . meloxicam (MOBIC) 15 MG tablet Take 1 tablet (15 mg total) by mouth daily. 90 tablet 0  .  venlafaxine XR (EFFEXOR-XR) 75 MG 24 hr capsule Take 1 capsule (75 mg total) by mouth daily. 90 capsule 1   No facility-administered medications prior to visit.    Review of Systems:  Patient denies headache, fevers, malaise, unintentional weight loss, skin rash, eye pain, sinus congestion and sinus pain, sore throat, dysphagia,  hemoptysis , cough, dyspnea, wheezing, chest pain, palpitations, orthopnea, edema, abdominal pain,  nausea, melena, diarrhea, constipation, flank pain, dysuria, hematuria, urinary  Frequency, nocturia, numbness, tingling, seizures,  Focal weakness, Loss of consciousness,  Tremor, insomnia, depression, anxiety, and suicidal ideation.     Objective:  BP 102/70 (BP Location: Left Arm, Patient Position: Sitting, Cuff Size: Normal)   Pulse 79   Temp (!) 97 F (36.1 C) (Temporal)   Resp 15   Ht 4\' 11"  (1.499 m)   Wt 175 lb 6.4 oz (79.6 kg)   SpO2 98%   BMI 35.43 kg/m   Physical Exam:  General appearance: alert, cooperative and appears stated age Ears: normal TM's and external ear canals both ears Throat: lips, mucosa, and tongue normal; teeth and gums normal Neck: no adenopathy, no carotid bruit, supple, symmetrical, trachea midline and thyroid not enlarged, symmetric, no tenderness/mass/nodules Back: symmetric, no curvature. ROM normal. No CVA tenderness. Lungs: clear to auscultation bilaterally Heart: regular rate and rhythm, S1, S2 normal, no murmur, click, rub or gallop Abdomen: soft, non-tender; bowel sounds normal; no masses,  no organomegaly Pulses: 2+ and symmetric Skin: Skin color, texture, turgor normal. No rashes or lesions Lymph nodes: Cervical, supraclavicular, and axillary nodes normal.  Assessment & Plan:   Problem List Items Addressed This Visit      Unprioritized   Colon cancer screening    She has had colonoscopies every 3 years due to history of abnormal colonoscopies, but her last abnormal colonoscopy was in 2012. Referral to [facility]       Diabetes mellitus without complication (HCC)    Currently well-controlled on metformin alone.  .  hemoglobin A1c is at goal of less than 7.0 . Patient is reminded to schedule an annual eye exam and foot exam is normal today. Patient has no microalbuminuria and is taking an ACE inhibitor for control of hypertension. Patient is advised to consider  statin therapy for CAD risk reduction   Lab Results  Component Value Date    HGBA1C 6.3 12/07/2019         Relevant Medications   metFORMIN (GLUCOPHAGE-XR) 500 MG 24 hr tablet   lisinopril-hydrochlorothiazide (ZESTORETIC) 10-12.5 MG tablet   atorvastatin (LIPITOR) 20 MG tablet   Other Relevant Orders   Hemoglobin A1c (Completed)   Comprehensive metabolic panel (Completed)   Direct LDL (Completed)   Microalbumin / creatinine urine ratio (Completed)   Hyperlipidemia associated with type 2 diabetes mellitus (HCC)    Statin therapy recommended for risk reduction .   Lab Results  Component Value Date   CHOL 215 (H) 07/09/2015   HDL 57.90 07/09/2015   LDLCALC 133 (H) 07/09/2015   LDLDIRECT 138.0 12/07/2019   TRIG 124.0 07/09/2015   CHOLHDL 4 07/09/2015         Relevant Medications   metFORMIN (GLUCOPHAGE-XR) 500 MG 24 hr tablet   lisinopril-hydrochlorothiazide (ZESTORETIC) 10-12.5 MG tablet   atorvastatin (LIPITOR) 20 MG tablet   Hypothyroidism - Primary    Thyroid is overactive on current dose of levothyroxine.  Will reduce dose to 88 mcg daily .   Lab Results  Component Value Date   TSH 0.29 (L) 12/07/2019  Relevant Medications   levothyroxine (SYNTHROID) 88 MCG tablet   Other Relevant Orders   TSH (Completed)   Obesity (BMI 30-39.9)    I have addressed  BMI and recommended a low glycemic index diet utilizing smaller more frequent meals to increase metabolism.  She has started the [employer]       Relevant Medications   metFORMIN (GLUCOPHAGE-XR) 500 MG 24 hr tablet      I have discontinued Marylou Mccoy. Creelman's HYDROcodone-acetaminophen, levothyroxine, estradiol, meloxicam, and levothyroxine. I have also changed her levothyroxine. Additionally, I am having her start on Fluticasone-Salmeterol and atorvastatin. Lastly, I am having her maintain her methocarbamol, pantoprazole, metFORMIN, venlafaxine XR, lisinopril-hydrochlorothiazide, Breo Ellipta, Acetaminophen, cholecalciferol, vitamin B-12, and triamcinolone cream.  Meds ordered  this encounter  Medications  . Fluticasone-Salmeterol (ADVAIR DISKUS) 250-50 MCG/DOSE AEPB    Sig: Inhale 1 puff into the lungs 2 (two) times daily.    Dispense:  3 each    Refill:  3  . atorvastatin (LIPITOR) 20 MG tablet    Sig: Take 1 tablet (20 mg total) by mouth daily.    Dispense:  90 tablet    Refill:  3  . levothyroxine (SYNTHROID) 88 MCG tablet    Sig: Take 1 tablet (88 mcg total) by mouth daily.    Dispense:  90 tablet    Refill:  1    Medications Discontinued During This Encounter  Medication Reason  . HYDROcodone-acetaminophen (NORCO/VICODIN) 5-325 MG per tablet Patient has not taken in last 30 days  . levothyroxine (SYNTHROID, LEVOTHROID) 112 MCG tablet Change in therapy  . venlafaxine XR (EFFEXOR-XR) 75 MG 24 hr capsule Change in therapy  . estradiol (ESTRACE) 1 MG tablet Patient has not taken in last 30 days  . meloxicam (MOBIC) 15 MG tablet Patient has not taken in last 30 days  . levothyroxine (SYNTHROID, LEVOTHROID) 112 MCG tablet Change in therapy  . levothyroxine (SYNTHROID) 100 MCG tablet    A total of 45 minutes of face to face time was spent with patient more than half of which was spent in counselling and coordination of care   Follow-up: Return in about 6 months (around 06/07/2020).   Crecencio Mc, MD

## 2019-12-07 NOTE — Patient Instructions (Signed)
Welcome back!  See you in 6 months unless your A1c is > 7.0    You may have vocal cord dysfunction,  Not asthma (see below)    Paradoxical Vocal Fold Motion  Paradoxical vocal fold motion is a condition that causes shortness of breath and noisy breathing (stridor). It may also be called vocal cord dysfunction. Normally, the vocal cords (vocal folds) inside the voice box (larynx) open when you breathe in and out. If you have paradoxical vocal fold motion, your vocal cords close when you try to breathe. This makes breathing difficult. This condition can be scary, but it is usually not dangerous. It can be confused with asthma because the symptoms are similar. What are the causes? Paradoxical vocal fold motion is caused by overreaction (hyperexcitability) of the nerves that provide movement and feeling in the vocal cords. The cause of hyperexcitability is not known, but sometimes triggers can be identified. Common triggers of vocal cord hyperexcitability include:  Stomach acid flowing up into your throat (gastric reflux).  Mucus going down the back of your throat (postnasal drip).  Infection in the nose, mouth, throat, or larynx (upper respiratory tract infection).  Exercise.  Strong smells.  Cigarette smoke or other irritating substances in the air.  Stress.  Allergies. What are the signs or symptoms? Symptoms of this condition include:  Shortness of breath. This may happen at rest or during exercise.  Frequent coughing and clearing the throat.  A feeling of choking or tightness in the throat.  Stridor or wheezing.  Hoarse voice. Symptoms may come and go. They do not occur during sleep. They may range from mild to severe. How is this diagnosed? This condition can be hard to diagnose because it comes and goes and is similar to asthma. Your health care provider may suspect paradoxical vocal fold motion if you do not have symptoms while you sleep or if asthma medicines do not  relieve your symptoms. Your health care provider may:  Have you work with (refer you to) an ears, nose, and throat specialist (otolaryngologist, ENT) or a speech and language specialist (speech-language pathologist) who will examine your vocal cords to see if they close when you breathe. This examination is usually done by inserting a thin, flexible tube with a light and camera on the end of it through your nose to look down into your larynx (laryngoscopy). This is the most reliable way to diagnose the condition.  Refer you to a breathing specialist (pulmonologist) who will do breathing tests (spirometry or pulmonary function tests, PFTs) to check for a breathing pattern that is typical for this condition.  Do a physical exam.  Ask about your symptoms and what seems to cause them (your triggers).  Listen to your breathing.  Test your blood for: ? Oxygen levels. ? Allergies.  Do a chest X-ray. How is this treated? This condition is treated with speech or voice therapy, which includes breathing and relaxation exercises to help you learn how to relax and control your vocal cords. After you learn those exercises, you can practice them at home and use them to stop an attack. Talk therapy (psychotherapy) with a psychologist can help you learn ways to reduce stress and anxiety and avoid triggers. In some cases, your health care provider may recommend medicines to treat an underlying condition that can trigger paradoxical vocal fold motion. These may include medicines for:  Postnasal drip.  Gastric reflux.  Upper respiratory tract infection.  Allergies.  Stress. Follow these instructions at  home:  Take over-the-counter and prescription medicines only as told by your health care provider.  Follow instructions from your speech or voice therapist about practicing your vocal cord relaxation and breathing exercises at home.  Follow instructions from your psychologist about how to lower your  stress and anxiety.  Return to your normal activities as told by your health care provider. Ask your health care provider what activities are safe for you.  Avoid triggers.  Do not use any products that contain nicotine or tobacco, such as cigarettes and e-cigarettes. If you need help quitting, ask your health care provider. Cigarette smoke may trigger this condition.  Keep all follow-up visits as told by your health care providers, including therapists. This is important. Contact a health care provider if:  Your symptoms do not get better with treatment and home care. Get help right away if:  You have chest pain.  You have trouble breathing that is not relieved by doing exercises and following instructions from your health care providers. Summary  Paradoxical vocal fold motion is a condition that causes your vocal cords (vocal folds) to close when you try to breathe.  This condition is often triggered by stress or irritation of your vocal cords.  Symptoms include sudden shortness of breath and noisy breathing (stridor).  Doing a vocal cord exam (laryngoscopy) is the most reliable way to diagnose this condition.  This condition is treated with speech or voice therapy and talk therapy (psychotherapy). This information is not intended to replace advice given to you by your health care provider. Make sure you discuss any questions you have with your health care provider. Document Revised: 09/30/2018 Document Reviewed: 02/04/2017 Elsevier Patient Education  2020 [employer].

## 2019-12-08 ENCOUNTER — Encounter: Payer: Self-pay | Admitting: Internal Medicine

## 2019-12-08 DIAGNOSIS — E785 Hyperlipidemia, unspecified: Secondary | ICD-10-CM | POA: Insufficient documentation

## 2019-12-08 DIAGNOSIS — Z1211 Encounter for screening for malignant neoplasm of colon: Secondary | ICD-10-CM | POA: Insufficient documentation

## 2019-12-08 LAB — HEMOGLOBIN A1C: Hgb A1c MFr Bld: 6.3 % (ref 4.6–6.5)

## 2019-12-08 MED ORDER — ATORVASTATIN CALCIUM 20 MG PO TABS: 20.0000 mg | ORAL_TABLET | Freq: Every day | ORAL | 3 refills | Status: DC

## 2019-12-08 MED ORDER — LEVOTHYROXINE SODIUM 88 MCG PO TABS: 88.0000 ug | ORAL_TABLET | Freq: Every day | ORAL | 1 refills | Status: DC

## 2019-12-08 NOTE — Assessment & Plan Note (Signed)
Statin therapy recommended for risk reduction .   Lab Results  Component Value Date   CHOL 215 (H) 07/09/2015   HDL 57.90 07/09/2015   LDLCALC 133 (H) 07/09/2015   LDLDIRECT 138.0 12/07/2019   TRIG 124.0 07/09/2015   CHOLHDL 4 07/09/2015

## 2019-12-08 NOTE — Assessment & Plan Note (Signed)
Currently well-controlled on metformin alone.  .  hemoglobin A1c is at goal of less than 7.0 . Patient is reminded to schedule an annual eye exam and foot exam is normal today. Patient has no microalbuminuria and is taking an ACE inhibitor for control of hypertension. Patient is advised to consider  statin therapy for CAD risk reduction   Lab Results  Component Value Date   HGBA1C 6.3 12/07/2019

## 2019-12-08 NOTE — Assessment & Plan Note (Signed)
Thyroid is overactive on current dose of levothyroxine.  Will reduce dose to 88 mcg daily .   Lab Results  Component Value Date   TSH 0.29 (L) 12/07/2019

## 2019-12-08 NOTE — Assessment & Plan Note (Signed)
I have addressed  BMI and recommended a low glycemic index diet utilizing smaller more frequent meals to increase metabolism.  She has started the [employer]

## 2019-12-08 NOTE — Assessment & Plan Note (Signed)
She has had colonoscopies every 3 years due to history of abnormal colonoscopies, but her last abnormal colonoscopy was in 2012. Referral to [facility]

## 2019-12-09 ENCOUNTER — Ambulatory Visit: Payer: Self-pay | Admitting: Internal Medicine

## 2019-12-12 ENCOUNTER — Ambulatory Visit: Payer: Self-pay | Admitting: Internal Medicine

## 2019-12-23 LAB — HM MAMMOGRAPHY

## 2020-04-03 MED ORDER — ATORVASTATIN CALCIUM 20 MG PO TABS: 20.0000 mg | ORAL_TABLET | Freq: Every day | ORAL | 3 refills | Status: DC

## 2020-04-03 MED ORDER — LEVOTHYROXINE SODIUM 88 MCG PO TABS: 88.0000 ug | ORAL_TABLET | Freq: Every day | ORAL | 1 refills | Status: DC

## 2020-04-18 ENCOUNTER — Telehealth (INDEPENDENT_AMBULATORY_CARE_PROVIDER_SITE_OTHER): Payer: BC Managed Care – PPO | Admitting: Internal Medicine

## 2020-04-18 ENCOUNTER — Other Ambulatory Visit: Payer: Self-pay

## 2020-04-18 ENCOUNTER — Encounter: Payer: Self-pay | Admitting: Internal Medicine

## 2020-04-18 DIAGNOSIS — F430 Acute stress reaction: Secondary | ICD-10-CM

## 2020-04-18 DIAGNOSIS — E1169 Type 2 diabetes mellitus with other specified complication: Secondary | ICD-10-CM | POA: Diagnosis not present

## 2020-04-18 DIAGNOSIS — E669 Obesity, unspecified: Secondary | ICD-10-CM | POA: Diagnosis not present

## 2020-04-18 DIAGNOSIS — E119 Type 2 diabetes mellitus without complications: Secondary | ICD-10-CM | POA: Diagnosis not present

## 2020-04-18 DIAGNOSIS — F411 Generalized anxiety disorder: Secondary | ICD-10-CM | POA: Diagnosis not present

## 2020-04-18 DIAGNOSIS — E785 Hyperlipidemia, unspecified: Secondary | ICD-10-CM

## 2020-04-18 MED ORDER — FLUTICASONE-SALMETEROL 250-50 MCG/DOSE IN AEPB
1.0000 | INHALATION_SPRAY | Freq: Two times a day (BID) | RESPIRATORY_TRACT | 3 refills | Status: DC
Start: 2020-04-18 — End: 2020-04-19

## 2020-04-18 MED ORDER — LISINOPRIL-HYDROCHLOROTHIAZIDE 10-12.5 MG PO TABS
1.0000 | ORAL_TABLET | Freq: Every day | ORAL | 1 refills | Status: DC
Start: 2020-04-18 — End: 2020-04-19

## 2020-04-18 MED ORDER — TRIAMCINOLONE ACETONIDE 0.1 % EX CREA: 1.0000 "application " | TOPICAL_CREAM | Freq: Two times a day (BID) | CUTANEOUS | 1 refills | Status: DC

## 2020-04-18 MED ORDER — ALPRAZOLAM 0.25 MG PO TABS: 0.2500 mg | ORAL_TABLET | Freq: Every day | ORAL | 0 refills | Status: DC | PRN

## 2020-04-18 MED ORDER — ATORVASTATIN CALCIUM 20 MG PO TABS
20.0000 mg | ORAL_TABLET | Freq: Every day | ORAL | 3 refills | Status: DC
Start: 2020-04-18 — End: 2020-04-19

## 2020-04-18 MED ORDER — METHOCARBAMOL 750 MG PO TABS
750.0000 mg | ORAL_TABLET | Freq: Three times a day (TID) | ORAL | 6 refills | Status: DC
Start: 2020-04-18 — End: 2020-04-19

## 2020-04-18 MED ORDER — LEVOTHYROXINE SODIUM 88 MCG PO TABS
88.0000 ug | ORAL_TABLET | Freq: Every day | ORAL | 1 refills | Status: DC
Start: 2020-04-18 — End: 2020-04-19

## 2020-04-18 NOTE — Progress Notes (Signed)
Virtual Visit converted to Telephone   This visit type was conducted due to national recommendations for restrictions regarding the COVID-19 pandemic (e.g. social distancing).  This format is felt to be most appropriate for this patient at this time.  All issues noted in this document were discussed and addressed.  No physical exam was performed (except for noted visual exam findings with Video Visits).   I connected with@ on 04/18/20 at  4:30 PM EDT by telephone   and verified that I am speaking with the correct person using two identifiers. Location patient: home Location provider: work or home office Persons participating in the virtual visit: patient, provider  I discussed the limitations, risks, security and privacy concerns of performing an evaluation and management service by telephone and the availability of in person appointments. I also discussed with the patient that there may be a patient responsible charge related to this service. The patient expressed understanding and agreed to proceed.   Reason for visit: follow up on type 2 DM, hyperlipidemia ,hypothyroidism and obesity  HPI:  59  yr old Natasha Pitts presents for follow up on multiple issues.  Obesity:  She initially Lost 12 lbs on [employer],  Went from a Size 14 to 12 .  walking daily  , not stress eating  Stress:  She is grieving the loss of a friend [place] suddenly to ALS after a short battle . 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  Requesting a prn dose of alprazolam to help her maintain composure and avoid confrontations that are no longer considered "safe"   ROS: See pertinent positives and negatives per HPI.  Past Medical History:  Diagnosis Date  . Anxiety   . Hypothyroidism   . Migraines    improved with menopasue  . PONV (postoperative nausea and vomiting)   .  Seasonal allergies   . Thyroid disease     Past Surgical History:  Procedure Laterality Date  . ABDOMINAL HYSTERECTOMY  May 1995   and left oophorectomy  . ANTERIOR CERVICAL DECOMP/DISCECTOMY FUSION N/A 4/Natasha/2015   Procedure: ANTERIOR CERVICAL DECOMPRESSION/DISCECTOMY FUSION Cervical Six-Seven;  Surgeon: Temple Pacini, MD;  Location: [facility];  Service: Neurosurgery;  Laterality: N/A;  ANTERIOR CERVICAL DECOMPRESSION/DISCECTOMY FUSION Cervical Six-Seven  . CHOLECYSTECTOMY    . NASAL SINUS SURGERY Bilateral   . SHOULDER ARTHROSCOPY W/ CAPSULAR REPAIR  2008   Miller, with CTS   . SPINE SURGERY      Family History  Problem Relation Age of Onset  . Hyperlipidemia Mother   . Hypothyroidism Mother   . Hypothyroidism Father   . Heart disease Maternal Grandmother   . Heart disease Maternal Grandfather   . Cancer Paternal Grandfather        lung  . Hypothyroidism Sister   . Hypothyroidism Brother   . Hypothyroidism Son   . Diabetes Paternal Grandmother   . Stroke Paternal Grandmother     SOCIAL HX:  reports that she has never smoked. She has never used smokeless tobacco. She reports that she does not drink alcohol and does not use drugs.   Current Outpatient Medications:  .  Acetaminophen 500 MG capsule, Take by mouth., Disp: , Rfl:  .  cholecalciferol (VITAMIN D3) 25 MCG (1000 UNIT) tablet, Take 1,000 Units by mouth daily., Disp: , Rfl:  .  ibuprofen (ADVIL) 200 MG  tablet, Take 400 mg by mouth 2 (two) times daily., Disp: , Rfl:  .  metFORMIN (GLUCOPHAGE-XR) 500 MG 24 hr tablet, Take 500 mg by mouth 2 (two) times daily., Disp: , Rfl:  .  pantoprazole (PROTONIX) 20 MG tablet, Take 20 mg by mouth daily., Disp: , Rfl:  .  venlafaxine XR (EFFEXOR-XR) 150 MG 24 hr capsule, Take 150 mg by mouth daily., Disp: , Rfl:  .  vitamin B-12 (CYANOCOBALAMIN) 1000 MCG tablet, Take 1,000 mcg by mouth daily., Disp: , Rfl:  .  ALPRAZolam (XANAX) 0.25 MG tablet, Take 1 tablet (0.25 mg total) by mouth  daily as needed for anxiety., Disp: 30 tablet, Rfl: 0 .  atorvastatin (LIPITOR) 20 MG tablet, Take 1 tablet (20 mg total) by mouth daily., Disp: 90 tablet, Rfl: 3 .  Fluticasone-Salmeterol (ADVAIR DISKUS) 250-50 MCG/DOSE AEPB, Inhale 1 puff into the lungs 2 (two) times daily., Disp: 3 each, Rfl: 3 .  levothyroxine (SYNTHROID) 88 MCG tablet, Take 1 tablet (88 mcg total) by mouth daily., Disp: 90 tablet, Rfl: 1 .  lisinopril-hydrochlorothiazide (ZESTORETIC) 10-12.5 MG tablet, Take 1 tablet by mouth daily., Disp: 90 tablet, Rfl: 1 .  methocarbamol (ROBAXIN) 750 MG tablet, Take 1 tablet (750 mg total) by mouth 3 (three) times daily., Disp: 270 tablet, Rfl: 1 .  triamcinolone cream (KENALOG) 0.1 %, Apply 1 application topically 2 (two) times daily., Disp: 30 g, Rfl: 1  EXAM:   General impression: alert, cooperative and articulate.  No signs of being in distress  Lungs: speech is fluent sentence length suggests that patient is not short of breath and not punctuated by cough, sneezing or sniffing. Marland Kitchen   Psych: affect normal.  speech is articulate and non pressured .  Denies suicidal thoughts  ASSESSMENT AND PLAN:  Discussed the following assessment and plan:  Obesity (BMI 30-39.9)  Diabetes mellitus without complication (HCC)  Anxiety as acute reaction to exceptional stress  Hyperlipidemia associated with type 2 diabetes mellitus (HCC)  Obesity (BMI 30-39.9) Encouraged her to resume the [employer] diet and continue regular exercise   Diabetes mellitus without complication Historically well-controlled on metformin alone.  hemoglobin A1c is at goal of less than Natasha.0 . Patient is reminded to schedule labs, an annual eye exam and foot exam is normal today. Patient has no microalbuminuria and is taking an ACE inhibitor for control of hypertension. Patient is advised to continue atorvastatin 20 mg daily for CAD risk reduction   Lab Results  Component Value Date   HGBA1C 6.3 12/07/2019     Anxiety  as acute reaction to exceptional stress I have prescribed alprazolam to help manage her frustration and anger The risks and benefits of benzodiazepine use were discussed with patient today including excessive sedation leading to respiratory depression,  impaired thinking/driving, and addiction.  Patient was advised to avoid concurrent use with alcohol, to use medication only as needed and not to share with others  .   Hyperlipidemia associated with type 2 diabetes mellitus (HCC) LDL and triglycerides are at goal on atorvastatin 20 mg daily .  She is due for recheck on  liver enzymes. No changes today   Lab Results  Component Value Date   CHOL 215 (H) 07/09/2015   HDL 57.90 07/09/2015   LDLCALC 133 (H) 07/09/2015   LDLDIRECT 138.0 12/07/2019   TRIG 124.0 07/09/2015   CHOLHDL 4 07/09/2015       I discussed the assessment and treatment plan with the patient. The patient was provided an  opportunity to ask questions and all were answered. The patient agreed with the plan and demonstrated an understanding of the instructions.   The patient was advised to call back or seek an in-person evaluation if the symptoms worsen or if the condition fails to improve as anticipated.  I provided 30 minutes of non-face-to-face time during this encounter.   Sherlene Shams, MD

## 2020-04-19 ENCOUNTER — Other Ambulatory Visit: Payer: Self-pay

## 2020-04-19 DIAGNOSIS — F411 Generalized anxiety disorder: Secondary | ICD-10-CM | POA: Insufficient documentation

## 2020-04-19 MED ORDER — FLUTICASONE-SALMETEROL 250-50 MCG/DOSE IN AEPB
1.0000 | INHALATION_SPRAY | Freq: Two times a day (BID) | RESPIRATORY_TRACT | 3 refills | Status: DC
Start: 2020-04-19 — End: 2020-04-26

## 2020-04-19 MED ORDER — TRIAMCINOLONE ACETONIDE 0.1 % EX CREA
1.0000 | TOPICAL_CREAM | Freq: Two times a day (BID) | CUTANEOUS | 1 refills | Status: DC
Start: 2020-04-19 — End: 2020-05-16

## 2020-04-19 MED ORDER — LEVOTHYROXINE SODIUM 88 MCG PO TABS
88.0000 ug | ORAL_TABLET | Freq: Every day | ORAL | 1 refills | Status: DC
Start: 2020-04-19 — End: 2020-04-26

## 2020-04-19 MED ORDER — METHOCARBAMOL 750 MG PO TABS
750.0000 mg | ORAL_TABLET | Freq: Three times a day (TID) | ORAL | 1 refills | Status: DC
Start: 2020-04-19 — End: 2020-04-26

## 2020-04-19 MED ORDER — ATORVASTATIN CALCIUM 20 MG PO TABS
20.0000 mg | ORAL_TABLET | Freq: Every day | ORAL | 3 refills | Status: DC
Start: 2020-04-19 — End: 2020-04-26

## 2020-04-19 MED ORDER — LISINOPRIL-HYDROCHLOROTHIAZIDE 10-12.5 MG PO TABS
1.0000 | ORAL_TABLET | Freq: Every day | ORAL | 1 refills | Status: DC
Start: 2020-04-19 — End: 2020-04-26

## 2020-04-19 NOTE — Assessment & Plan Note (Signed)
Historically well-controlled on metformin alone.  hemoglobin A1c is at goal of less than 7.0 . Patient is reminded to schedule labs, an annual eye exam and foot exam is normal today. Patient has no microalbuminuria and is taking an ACE inhibitor for control of hypertension. Patient is advised to continue atorvastatin 20 mg daily for CAD risk reduction   Lab Results  Component Value Date   HGBA1C 6.3 12/07/2019

## 2020-04-19 NOTE — Assessment & Plan Note (Signed)
I have prescribed alprazolam to help manage her frustration and anger The risks and benefits of benzodiazepine use were discussed with patient today including excessive sedation leading to respiratory depression,  impaired thinking/driving, and addiction.  Patient was advised to avoid concurrent use with alcohol, to use medication only as needed and not to share with others  .

## 2020-04-19 NOTE — Assessment & Plan Note (Signed)
Encouraged her to resume the [employer] diet and continue regular exercise

## 2020-04-19 NOTE — Assessment & Plan Note (Signed)
LDL and triglycerides are at goal on atorvastatin 20 mg daily .  She is due for recheck on  liver enzymes. No changes today   Lab Results  Component Value Date   CHOL 215 (H) 07/09/2015   HDL 57.90 07/09/2015   LDLCALC 133 (H) 07/09/2015   LDLDIRECT 138.0 12/07/2019   TRIG 124.0 07/09/2015   CHOLHDL 4 07/09/2015

## 2020-04-26 ENCOUNTER — Other Ambulatory Visit: Payer: Self-pay

## 2020-04-26 MED ORDER — FLUTICASONE-SALMETEROL 250-50 MCG/DOSE IN AEPB
1.0000 | INHALATION_SPRAY | Freq: Two times a day (BID) | RESPIRATORY_TRACT | 3 refills | Status: DC
Start: 2020-04-26 — End: 2023-02-18

## 2020-04-26 MED ORDER — LEVOTHYROXINE SODIUM 88 MCG PO TABS
88.0000 ug | ORAL_TABLET | Freq: Every day | ORAL | 1 refills | Status: DC
Start: 2020-04-26 — End: 2020-05-16

## 2020-04-26 MED ORDER — METHOCARBAMOL 750 MG PO TABS
750.0000 mg | ORAL_TABLET | Freq: Three times a day (TID) | ORAL | 1 refills | Status: DC
Start: 2020-04-26 — End: 2020-05-16

## 2020-04-26 MED ORDER — LISINOPRIL-HYDROCHLOROTHIAZIDE 10-12.5 MG PO TABS
1.0000 | ORAL_TABLET | Freq: Every day | ORAL | 1 refills | Status: DC
Start: 2020-04-26 — End: 2020-08-13

## 2020-04-26 MED ORDER — ATORVASTATIN CALCIUM 20 MG PO TABS: 20.0000 mg | ORAL_TABLET | Freq: Every day | ORAL | 3 refills | Status: DC

## 2020-05-16 ENCOUNTER — Other Ambulatory Visit: Payer: Self-pay

## 2020-05-16 MED ORDER — METHOCARBAMOL 750 MG PO TABS
750.0000 mg | ORAL_TABLET | Freq: Three times a day (TID) | ORAL | 1 refills | Status: DC
Start: 2020-05-16 — End: 2020-11-29

## 2020-05-16 MED ORDER — LEVOTHYROXINE SODIUM 88 MCG PO TABS: 88.0000 ug | ORAL_TABLET | Freq: Every day | ORAL | 1 refills | Status: DC

## 2020-05-16 MED ORDER — TRIAMCINOLONE ACETONIDE 0.1 % EX CREA
1.0000 | TOPICAL_CREAM | Freq: Two times a day (BID) | CUTANEOUS | 1 refills | Status: DC
Start: 2020-05-16 — End: 2020-08-13

## 2020-07-06 ENCOUNTER — Ambulatory Visit: Payer: BC Managed Care – PPO | Admitting: Internal Medicine

## 2020-07-11 ENCOUNTER — Ambulatory Visit: Payer: BC Managed Care – PPO | Admitting: Internal Medicine

## 2020-08-13 ENCOUNTER — Encounter: Payer: Self-pay | Admitting: Internal Medicine

## 2020-08-13 ENCOUNTER — Other Ambulatory Visit: Payer: Self-pay

## 2020-08-13 ENCOUNTER — Ambulatory Visit: Payer: Self-pay | Admitting: Internal Medicine

## 2020-08-13 VITALS — BP 124/82 | HR 84 | Temp 97.7°F | Resp 14 | Ht 59.0 in | Wt 169.2 lb

## 2020-08-13 DIAGNOSIS — Z23 Encounter for immunization: Secondary | ICD-10-CM

## 2020-08-13 DIAGNOSIS — E785 Hyperlipidemia, unspecified: Secondary | ICD-10-CM

## 2020-08-13 DIAGNOSIS — E1169 Type 2 diabetes mellitus with other specified complication: Secondary | ICD-10-CM | POA: Diagnosis not present

## 2020-08-13 DIAGNOSIS — E669 Obesity, unspecified: Secondary | ICD-10-CM | POA: Diagnosis not present

## 2020-08-13 DIAGNOSIS — E034 Atrophy of thyroid (acquired): Secondary | ICD-10-CM | POA: Diagnosis not present

## 2020-08-13 DIAGNOSIS — E119 Type 2 diabetes mellitus without complications: Secondary | ICD-10-CM

## 2020-08-13 DIAGNOSIS — F411 Generalized anxiety disorder: Secondary | ICD-10-CM

## 2020-08-13 DIAGNOSIS — E1159 Type 2 diabetes mellitus with other circulatory complications: Secondary | ICD-10-CM

## 2020-08-13 DIAGNOSIS — M5106 Intervertebral disc disorders with myelopathy, lumbar region: Secondary | ICD-10-CM

## 2020-08-13 DIAGNOSIS — I152 Hypertension secondary to endocrine disorders: Secondary | ICD-10-CM

## 2020-08-13 LAB — COMPREHENSIVE METABOLIC PANEL
ALT: 21 U/L (ref 0–35)
AST: 17 U/L (ref 0–37)
Albumin: 4.2 g/dL (ref 3.5–5.2)
Alkaline Phosphatase: 72 U/L (ref 39–117)
BUN: 13 mg/dL (ref 6–23)
CO2: 25 mEq/L (ref 19–32)
Calcium: 10 mg/dL (ref 8.4–10.5)
Chloride: 105 mEq/L (ref 96–112)
Creatinine, Ser: 0.79 mg/dL (ref 0.40–1.20)
GFR: 81.58 mL/min (ref >60.00)
Glucose, Bld: 102 mg/dL — ABNORMAL HIGH (ref 70–99)
Potassium: 4.3 mEq/L (ref 3.5–5.1)
Sodium: 139 mEq/L (ref 135–145)
Total Bilirubin: 0.4 mg/dL (ref 0.2–1.2)
Total Protein: 6.7 g/dL (ref 6.0–8.3)

## 2020-08-13 LAB — MICROALBUMIN / CREATININE URINE RATIO
Creatinine,U: 99.6 mg/dL
Microalb Creat Ratio: 0.9 mg/g (ref 0.0–30.0)
Microalb, Ur: 0.9 mg/dL (ref 0.0–1.9)

## 2020-08-13 LAB — LIPID PANEL
Cholesterol: 181 mg/dL (ref 0–200)
HDL: 51.4 mg/dL (ref >39.00)
LDL Cholesterol: 97 mg/dL (ref 0–99)
NonHDL: 129.24
Total CHOL/HDL Ratio: 4
Triglycerides: 162 mg/dL — ABNORMAL HIGH (ref 0.0–149.0)
VLDL: 32.4 mg/dL (ref 0.0–40.0)

## 2020-08-13 LAB — HEMOGLOBIN A1C: Hgb A1c MFr Bld: 6 % (ref 4.6–6.5)

## 2020-08-13 LAB — TSH: TSH: 1.74 u[IU]/mL (ref 0.35–4.50)

## 2020-08-13 MED ORDER — TRIAMCINOLONE ACETONIDE 0.1 % EX CREA: 1.0000 "application " | TOPICAL_CREAM | Freq: Two times a day (BID) | CUTANEOUS | 1 refills | Status: DC

## 2020-08-13 MED ORDER — VENLAFAXINE HCL ER 75 MG PO CP24: 75.0000 mg | ORAL_CAPSULE | Freq: Every day | ORAL | 1 refills | Status: DC

## 2020-08-13 MED ORDER — LOSARTAN POTASSIUM-HCTZ 50-12.5 MG PO TABS: 1.0000 | ORAL_TABLET | Freq: Every day | ORAL | 3 refills | Status: DC

## 2020-08-13 NOTE — Patient Instructions (Signed)
You received the Prevnar (Pneumonia) and the Tdap Vaccines today  Effexor dose reduced to 75 mg per your request  Lisinopril changed to losartan   Follow up  Is in 6 months unless a1c > 7.0, then 3 months

## 2020-08-13 NOTE — Progress Notes (Signed)
Subjective:  Patient ID: Natasha Pitts, female    DOB: June 29, 1960  Age: 60 y.o. MRN: [id number]  CC: The primary encounter diagnosis was Obesity, diabetes, and hypertension syndrome (HCC). Diagnoses of Hypothyroidism due to acquired atrophy of thyroid, Need for Tdap vaccination, Need for vaccination against Streptococcus pneumoniae using pneumococcal conjugate vaccine 13, Obesity (BMI 30-39.9), Lumbar disc disorder with myelopathy, Hyperlipidemia associated with type 2 diabetes mellitus (HCC), and Generalized anxiety disorder were also pertinent to this visit.  HPI Natasha Pitts presents for follow up on type 2 DM complicated by  Obesity, hyperlipidemia and  hypertension  This visit occurred during the SARS-CoV-2 public health emergency.  Safety protocols were in place, including screening questions prior to the visit, additional usage of staff PPE, and extensive cleaning of exam room while observing appropriate contact time as indicated for disinfecting solutions.   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  less stressful job .    No longer using xanax on a regular basis, has not refilled since  October 2021 (30 tablets).  Wants to reduce her dose of venlafaxine to 75 mg daily  Never started statin   Needs shingles vaccine  She has a remote history of a salivary gland excision, can't remember which side of neck . No records in chart.    Now having occasional  spasms on the left side .  Not occurrig while eating . Lasts a minute or so. Not with exertion  DM:  Not checking sugars.  Taking metformin  for glycemic control.  Has reduced  Her intake of sweets .  States that her goal is Walking on treadmill 3 days per week but not there yet.   Foot exam done .  Has peroneal nerve distribution deficits due to prior back surgeries  Outpatient Medications Prior to Visit  Medication Sig Dispense Refill  . Acetaminophen  500 MG capsule Take by mouth.    . cholecalciferol (VITAMIN D3) 25 MCG (1000 UNIT) tablet Take 1,000 Units by mouth daily.    Marland Kitchen ibuprofen (ADVIL) 200 MG tablet Take 400 mg by mouth 2 (two) times daily.    Marland Kitchen levothyroxine (SYNTHROID) 88 MCG tablet Take 1 tablet (88 mcg total) by mouth daily. 90 tablet 1  . metFORMIN (GLUCOPHAGE-XR) 500 MG 24 hr tablet Take 500 mg by mouth 2 (two) times daily.    . methocarbamol (ROBAXIN) 750 MG tablet Take 1 tablet (750 mg total) by mouth 3 (three) times daily. 270 tablet 1  . pantoprazole (PROTONIX) 20 MG tablet Take 20 mg by mouth daily.    . vitamin B-12 (CYANOCOBALAMIN) 1000 MCG tablet Take 1,000 mcg by mouth daily.    Marland Kitchen lisinopril-hydrochlorothiazide (ZESTORETIC) 10-12.5 MG tablet Take 1 tablet by mouth daily. 90 tablet 1  . triamcinolone (KENALOG) 0.1 % Apply 1 application topically 2 (two) times daily. 30 g 1  . venlafaxine XR (EFFEXOR-XR) 150 MG 24 hr capsule Take 150 mg by mouth daily.    Marland Kitchen albuterol (VENTOLIN HFA) 108 (90 Base) MCG/ACT inhaler Inhale into the lungs.    . Fluticasone-Salmeterol (ADVAIR DISKUS) 250-50 MCG/DOSE AEPB Inhale 1 puff into the lungs 2 (two) times daily. (Patient not taking: Reported on 08/13/2020) 3 each 3  . pantoprazole (PROTONIX) 40 MG tablet Take 40 mg by mouth daily.    Marland Kitchen ALPRAZolam (XANAX) 0.25 MG tablet Take 1 tablet (0.25 mg total) by mouth  daily as needed for anxiety. (Patient not taking: Reported on 08/13/2020) 30 tablet 0  . atorvastatin (LIPITOR) 20 MG tablet Take 1 tablet (20 mg total) by mouth daily. (Patient not taking: Reported on 08/13/2020) 90 tablet 3   No facility-administered medications prior to visit.    Review of Systems;  Patient denies headache, fevers, malaise, unintentional weight loss, skin rash, eye pain, sinus congestion and sinus pain, sore throat, dysphagia,  hemoptysis , cough, dyspnea, wheezing, chest pain, palpitations, orthopnea, edema, abdominal pain, nausea, melena, diarrhea, constipation,  flank pain, dysuria, hematuria, urinary  Frequency, nocturia, numbness, tingling, seizures,  Focal weakness, Loss of consciousness,  Tremor, insomnia, depression, anxiety, and suicidal ideation.      Objective:  BP 124/82 (BP Location: Left Arm, Patient Position: Sitting, Cuff Size: Normal)   Pulse 84   Temp 97.7 F (36.5 C) (Oral)   Resp 14   Ht 4\' 11"  (1.499 m)   Wt 169 lb 3.2 oz (76.7 kg)   SpO2 98%   BMI 34.17 kg/m   BP Readings from Last 3 Encounters:  08/13/20 124/82  12/07/19 102/70  05/23/15 126/80    Wt Readings from Last 3 Encounters:  08/13/20 169 lb 3.2 oz (76.7 kg)  04/18/20 168 lb (76.2 kg)  12/07/19 175 lb 6.4 oz (79.6 kg)    General appearance: alert, cooperative and appears stated age Ears: normal TM's and external ear canals both ears Throat: lips, mucosa, and tongue normal; teeth and gums normal Neck: no adenopathy, no carotid bruit, supple, symmetrical, trachea midline and thyroid not enlarged, symmetric, no tenderness/mass/nodules Back: symmetric, no curvature. ROM normal. No CVA tenderness. Lungs: clear to auscultation bilaterally Heart: regular rate and rhythm, S1, S2 normal, no murmur, click, rub or gallop Abdomen: soft, non-tender; bowel sounds normal; no masses,  no organomegaly Pulses: 2+ and symmetric Skin: Skin color, texture, turgor normal. No rashes or lesions Lymph nodes: Cervical, supraclavicular, and axillary nodes normal.  Lab Results  Component Value Date   HGBA1C 6.0 08/13/2020   HGBA1C 6.3 12/07/2019   HGBA1C 6.0 07/09/2015    Lab Results  Component Value Date   CREATININE 0.79 08/13/2020   CREATININE 0.86 12/07/2019   CREATININE 0.78 07/09/2015    Lab Results  Component Value Date   WBC 4.4 09/19/2013   HGB 13.2 09/19/2013   HCT 38.3 09/19/2013   PLT 278 09/19/2013   GLUCOSE 102 (H) 08/13/2020   CHOL 181 08/13/2020   TRIG 162.0 (H) 08/13/2020   HDL 51.40 08/13/2020   LDLDIRECT 138.0 12/07/2019   LDLCALC 97  08/13/2020   ALT 21 08/13/2020   AST 17 08/13/2020   NA 139 08/13/2020   K 4.3 08/13/2020   CL 105 08/13/2020   CREATININE 0.79 08/13/2020   BUN 13 08/13/2020   CO2 25 08/13/2020   TSH 1.74 08/13/2020   HGBA1C 6.0 08/13/2020   MICROALBUR 0.9 08/13/2020    No results found.  Assessment & Plan:   Problem List Items Addressed This Visit      Unprioritized   Obesity, diabetes, and hypertension syndrome (HCC) - Primary    Currently well-controlled onmetformin alone.   hemoglobin A1c is at goal of less than 7.0 . Patient is reminded to schedule an annual eye exam and foot exam is normal today. Patient has no microalbuminuria. Patient is advised to start statin therapy for CAD risk reduction and is advised to change ACE Inhibitor to ARB for renal protection and hypertension       Relevant  Medications   losartan-hydrochlorothiazide (HYZAAR) 50-12.5 MG tablet   atorvastatin (LIPITOR) 20 MG tablet   Other Relevant Orders   Hemoglobin A1c (Completed)   Microalbumin / creatinine urine ratio (Completed)   Lipid panel (Completed)   Comprehensive metabolic panel (Completed)   Obesity (BMI 30-39.9)    Not interested in ozempic at this time.  Continue metformin,  Low GI diet  encouraged to exercise regularly as permitted by back issues       Lumbar disc disorder with myelopathy    No longer managed with opioids.   Did not tolerate gabapentin due to sedating effects.  Continue prn use of MR and NSAID        Hypothyroidism    Thyroid function is WNL on 88 mcg levothyroxine..  No current changes needed.   Lab Results  Component Value Date   TSH 1.74 08/13/2020         Relevant Orders   TSH (Completed)   Hyperlipidemia associated with type 2 diabetes mellitus (HCC)    LDL and triglycerides are above goal  at goal and she is advised to start  atorvastatin 20 mg daily .    Lab Results  Component Value Date   CHOL 181 08/13/2020   HDL 51.40 08/13/2020   LDLCALC 97 08/13/2020    LDLDIRECT 138.0 12/07/2019   TRIG 162.0 (H) 08/13/2020   CHOLHDL 4 08/13/2020    Lab Results  Component Value Date   ALT 21 08/13/2020   AST 17 08/13/2020   ALKPHOS 72 08/13/2020   BILITOT 0.4 08/13/2020         Relevant Medications   losartan-hydrochlorothiazide (HYZAAR) 50-12.5 MG tablet   atorvastatin (LIPITOR) 20 MG tablet   Generalized anxiety disorder    She has not used alprazolam to help manage her frustration and anger  In several months,  Since changing jobs.  Reducing venlafaxine dose to 75 mg daily       Relevant Medications   venlafaxine XR (EFFEXOR-XR) 75 MG 24 hr capsule    Other Visit Diagnoses    Need for Tdap vaccination       Relevant Orders   Tdap vaccine greater than or equal to 7yo IM (Completed)   Need for vaccination against Streptococcus pneumoniae using pneumococcal conjugate vaccine 13       Relevant Orders   Pneumococcal conjugate vaccine 13-valent IM (Completed)      I have discontinued Janeth Rase. Natasha "[name]"'s ALPRAZolam and lisinopril-hydrochlorothiazide. I have also changed her venlafaxine XR. Additionally, I am having her start on losartan-hydrochlorothiazide. Lastly, I am having her maintain her metFORMIN, Acetaminophen, cholecalciferol, vitamin B-12, ibuprofen, pantoprazole, Fluticasone-Salmeterol, methocarbamol, levothyroxine, albuterol, pantoprazole, triamcinolone, and atorvastatin.  Meds ordered this encounter  Medications  . losartan-hydrochlorothiazide (HYZAAR) 50-12.5 MG tablet    Sig: Take 1 tablet by mouth daily.    Dispense:  90 tablet    Refill:  3  . triamcinolone (KENALOG) 0.1 %    Sig: Apply 1 application topically 2 (two) times daily.    Dispense:  30 g    Refill:  1  . venlafaxine XR (EFFEXOR-XR) 75 MG 24 hr capsule    Sig: Take 1 capsule (75 mg total) by mouth daily.    Dispense:  90 capsule    Refill:  1  . atorvastatin (LIPITOR) 20 MG tablet    Sig: Take 1 tablet (20 mg total) by mouth daily.    Dispense:  90  tablet    Refill:  3  Medications Discontinued During This Encounter  Medication Reason  . ALPRAZolam (XANAX) 0.25 MG tablet   . lisinopril-hydrochlorothiazide (ZESTORETIC) 10-12.5 MG tablet   . triamcinolone (KENALOG) 0.1 % Reorder  . venlafaxine XR (EFFEXOR-XR) 150 MG 24 hr capsule   . atorvastatin (LIPITOR) 20 MG tablet Reorder    Follow-up: Return in about 6 months (around 02/10/2021) for follow up diabetes.   Sherlene Shamseresa L Aquarius Latouche, MD

## 2020-08-14 ENCOUNTER — Telehealth: Payer: Self-pay | Admitting: Internal Medicine

## 2020-08-14 MED ORDER — ATORVASTATIN CALCIUM 20 MG PO TABS: 20.0000 mg | ORAL_TABLET | Freq: Every day | ORAL | 3 refills | Status: DC

## 2020-08-14 NOTE — Assessment & Plan Note (Addendum)
Thyroid function is WNL on 88 mcg levothyroxine..  No current changes needed.   Lab Results  Component Value Date   TSH 1.74 08/13/2020

## 2020-08-14 NOTE — Assessment & Plan Note (Addendum)
No longer managed with opioids.   Did not tolerate gabapentin due to sedating effects.  Continue prn use of MR and NSAID

## 2020-08-14 NOTE — Assessment & Plan Note (Signed)
Not interested in ozempic at this time.  Continue metformin,  Low GI diet  encouraged to exercise regularly as permitted by back issues

## 2020-08-14 NOTE — Assessment & Plan Note (Signed)
LDL and triglycerides are above goal  at goal and she is advised to start  atorvastatin 20 mg daily .    Lab Results  Component Value Date   CHOL 181 08/13/2020   HDL 51.40 08/13/2020   LDLCALC 97 08/13/2020   LDLDIRECT 138.0 12/07/2019   TRIG 162.0 (H) 08/13/2020   CHOLHDL 4 08/13/2020    Lab Results  Component Value Date   ALT 21 08/13/2020   AST 17 08/13/2020   ALKPHOS 72 08/13/2020   BILITOT 0.4 08/13/2020

## 2020-08-14 NOTE — Assessment & Plan Note (Signed)
Currently well-controlled onmetformin alone.   hemoglobin A1c is at goal of less than 7.0 . Patient is reminded to schedule an annual eye exam and foot exam is normal today. Patient has no microalbuminuria. Patient is advised to start statin therapy for CAD risk reduction and is advised to change ACE Inhibitor to ARB for renal protection and hypertension

## 2020-08-14 NOTE — Assessment & Plan Note (Signed)
She has not used alprazolam to help manage her frustration and anger  In several months,  Since changing jobs.  Reducing venlafaxine dose to 75 mg daily

## 2020-09-17 ENCOUNTER — Telehealth: Payer: Self-pay

## 2020-09-17 MED ORDER — METFORMIN HCL ER 500 MG PO TB24: 500.0000 mg | ORAL_TABLET | Freq: Two times a day (BID) | ORAL | 1 refills | Status: DC

## 2020-09-17 NOTE — Telephone Encounter (Signed)
Pt needs metFORMIN (GLUCOPHAGE-XR) 500 MG 24 hr tablet sent to [facility] [address]-please make that the primary pharmacy. Pt needs asap

## 2020-09-21 MED ORDER — VENLAFAXINE HCL ER 75 MG PO CP24: 75.0000 mg | ORAL_CAPSULE | Freq: Every day | ORAL | 1 refills | Status: DC

## 2020-09-21 MED ORDER — PANTOPRAZOLE SODIUM 40 MG PO TBEC: 40.0000 mg | DELAYED_RELEASE_TABLET | Freq: Every day | ORAL | 1 refills | Status: DC

## 2020-09-21 MED ORDER — LEVOTHYROXINE SODIUM 88 MCG PO TABS: 88.0000 ug | ORAL_TABLET | Freq: Every day | ORAL | 1 refills | Status: DC

## 2020-10-15 ENCOUNTER — Telehealth: Payer: BC Managed Care – PPO | Admitting: Internal Medicine

## 2020-11-28 ENCOUNTER — Other Ambulatory Visit: Payer: Self-pay | Admitting: Internal Medicine

## 2020-11-29 ENCOUNTER — Other Ambulatory Visit: Payer: Self-pay

## 2020-11-29 ENCOUNTER — Telehealth: Payer: Self-pay

## 2020-11-29 MED ORDER — VENLAFAXINE HCL ER 150 MG PO CP24: 150.0000 mg | ORAL_CAPSULE | Freq: Every day | ORAL | 1 refills | Status: DC

## 2020-11-29 MED ORDER — METFORMIN HCL ER 500 MG PO TB24: 500.0000 mg | ORAL_TABLET | Freq: Two times a day (BID) | ORAL | 0 refills | Status: DC

## 2020-11-29 MED ORDER — METHOCARBAMOL 750 MG PO TABS: 750.0000 mg | ORAL_TABLET | Freq: Three times a day (TID) | ORAL | 1 refills | Status: DC

## 2020-11-29 MED ORDER — VENLAFAXINE HCL 50 MG PO TABS: 50.0000 mg | ORAL_TABLET | Freq: Two times a day (BID) | ORAL | 1 refills | Status: DC

## 2020-11-29 MED ORDER — METFORMIN HCL ER 500 MG PO TB24: 500.0000 mg | ORAL_TABLET | Freq: Two times a day (BID) | ORAL | 1 refills | Status: DC

## 2020-11-29 NOTE — Telephone Encounter (Signed)
Agree.  Correct dose is 150 mg .

## 2020-11-29 NOTE — Telephone Encounter (Signed)
LMTCB

## 2020-11-30 ENCOUNTER — Other Ambulatory Visit: Payer: Self-pay

## 2020-11-30 ENCOUNTER — Other Ambulatory Visit: Payer: Self-pay | Admitting: Internal Medicine

## 2020-11-30 DIAGNOSIS — I152 Hypertension secondary to endocrine disorders: Secondary | ICD-10-CM

## 2020-11-30 MED ORDER — METFORMIN HCL ER 500 MG PO TB24: 500.0000 mg | ORAL_TABLET | Freq: Two times a day (BID) | ORAL | 0 refills | Status: DC

## 2020-11-30 MED ORDER — VENLAFAXINE HCL ER 150 MG PO CP24: 150.0000 mg | ORAL_CAPSULE | Freq: Every day | ORAL | 1 refills | Status: DC

## 2020-12-23 ENCOUNTER — Other Ambulatory Visit: Payer: Self-pay | Admitting: Internal Medicine

## 2021-01-22 ENCOUNTER — Other Ambulatory Visit: Payer: Self-pay | Admitting: Internal Medicine

## 2021-01-22 DIAGNOSIS — I152 Hypertension secondary to endocrine disorders: Secondary | ICD-10-CM

## 2021-01-22 DIAGNOSIS — E1159 Type 2 diabetes mellitus with other circulatory complications: Secondary | ICD-10-CM

## 2021-02-13 ENCOUNTER — Ambulatory Visit: Payer: BC Managed Care – PPO | Admitting: Internal Medicine

## 2021-02-13 LAB — HM MAMMOGRAPHY

## 2021-02-14 ENCOUNTER — Other Ambulatory Visit: Payer: Self-pay | Admitting: Internal Medicine

## 2021-02-22 ENCOUNTER — Other Ambulatory Visit: Payer: Self-pay | Admitting: Internal Medicine

## 2021-03-12 ENCOUNTER — Other Ambulatory Visit: Payer: Self-pay | Admitting: Internal Medicine

## 2021-04-30 HISTORY — PX: ABDOMINOPLASTY: SUR9

## 2021-07-17 ENCOUNTER — Ambulatory Visit (INDEPENDENT_AMBULATORY_CARE_PROVIDER_SITE_OTHER): Payer: BC Managed Care – PPO | Admitting: Internal Medicine

## 2021-07-17 ENCOUNTER — Encounter: Payer: Self-pay | Admitting: Internal Medicine

## 2021-07-17 ENCOUNTER — Other Ambulatory Visit: Payer: Self-pay

## 2021-07-17 VITALS — BP 122/82 | HR 102 | Temp 97.8°F | Ht 59.0 in | Wt 171.8 lb

## 2021-07-17 DIAGNOSIS — E1159 Type 2 diabetes mellitus with other circulatory complications: Secondary | ICD-10-CM

## 2021-07-17 DIAGNOSIS — E034 Atrophy of thyroid (acquired): Secondary | ICD-10-CM | POA: Diagnosis not present

## 2021-07-17 DIAGNOSIS — E1169 Type 2 diabetes mellitus with other specified complication: Secondary | ICD-10-CM | POA: Diagnosis not present

## 2021-07-17 DIAGNOSIS — Z0000 Encounter for general adult medical examination without abnormal findings: Secondary | ICD-10-CM

## 2021-07-17 DIAGNOSIS — M4722 Other spondylosis with radiculopathy, cervical region: Secondary | ICD-10-CM | POA: Diagnosis not present

## 2021-07-17 DIAGNOSIS — E669 Obesity, unspecified: Secondary | ICD-10-CM | POA: Diagnosis not present

## 2021-07-17 DIAGNOSIS — I152 Hypertension secondary to endocrine disorders: Secondary | ICD-10-CM

## 2021-07-17 DIAGNOSIS — E785 Hyperlipidemia, unspecified: Secondary | ICD-10-CM

## 2021-07-17 DIAGNOSIS — K2100 Gastro-esophageal reflux disease with esophagitis, without bleeding: Secondary | ICD-10-CM | POA: Diagnosis not present

## 2021-07-17 LAB — LIPID PANEL W/REFLEX DIRECT LDL
Cholesterol: 244 mg/dL — ABNORMAL HIGH (ref <200)
HDL: 44 mg/dL — ABNORMAL LOW (ref >=50)
LDL Cholesterol (Calc): 142 mg/dL (calc) — ABNORMAL HIGH
Non-HDL Cholesterol (Calc): 200 mg/dL (calc) — ABNORMAL HIGH (ref <130)
Total CHOL/HDL Ratio: 5.5 (calc) — ABNORMAL HIGH (ref <5.0)
Triglycerides: 375 mg/dL — ABNORMAL HIGH (ref <150)

## 2021-07-17 MED ORDER — GABAPENTIN 100 MG PO CAPS: 100.0000 mg | ORAL_CAPSULE | Freq: Three times a day (TID) | ORAL | 1 refills | Status: DC

## 2021-07-17 MED ORDER — OZEMPIC (0.25 OR 0.5 MG/DOSE) 2 MG/1.5ML ~~LOC~~ SOPN: 0.2500 mg | PEN_INJECTOR | SUBCUTANEOUS | 2 refills | Status: DC

## 2021-07-17 NOTE — Progress Notes (Signed)
Patient ID: Natasha Pitts, female    DOB: [birth year]  Age: 61 y.o. MRN: [id number]  The patient is here for annual preventive examination and management of other chronic and acute problems.   The risk factors are reflected in the social history.  The roster of all physicians providing medical care to patient - is listed in the Snapshot section of the chart.  Activities of daily living:  The patient is 100% independent in all ADLs: dressing, toileting, feeding as well as independent mobility  Home safety : The patient has smoke detectors in the home. They wear seatbelts.  There are no firearms at home. There is no violence in the home.   There is no risks for hepatitis, STDs or HIV. There is no   history of blood transfusion. They have no travel history to infectious disease endemic areas of the world.  The patient has seen their dentist in the last six month. They have seen their eye doctor in the last year. They admit to slight hearing difficulty with regard to whispered voices and some television programs.  They have deferred audiologic testing in the last year.  They do not  have excessive sun exposure. Discussed the need for sun protection: hats, long sleeves and use of sunscreen if there is significant sun exposure.   Diet: the importance of a healthy diet is discussed. They do have a healthy diet.  The benefits of regular aerobic exercise were discussed. She walks 4 times per week ,  20 minutes.   Depression screen: there are no signs or vegative symptoms of depression- irritability, change in appetite, anhedonia, sadness/tearfullness.  Cognitive assessment: the patient manages all their financial and personal affairs and is actively engaged. They could relate day,date,year and events; recalled 2/3 objects at 3 minutes; performed clock-face test normally.  The following portions of the patient's history were reviewed and updated as appropriate: allergies, current medications, past  family history, past medical history,  past surgical history, past social history  and problem list.  Visual acuity was not assessed per patient preference since she has regular follow up with her ophthalmologist. Hearing and body mass index were assessed and reviewed.   During the course of the visit the patient was educated and counseled about appropriate screening and preventive services including : fall prevention , diabetes screening, nutrition counseling, colorectal cancer screening, and recommended immunizations.    CC: The primary encounter diagnosis was Hypothyroidism due to acquired atrophy of thyroid. Diagnoses of Gastroesophageal reflux disease with esophagitis without hemorrhage, Hyperlipidemia associated with type 2 diabetes mellitus ([place]), Obesity, diabetes, and hypertension syndrome ([place]), Obesity (BMI 30-39.9), Cervical spondylosis with radiculopathy, and Routine general medical examination at a health care facility were also pertinent to this visit.  1)s/p abdominoplasty  by  Bluford Main Nov 2022 in [place]  uncomplicated surgery,  had 2 drains.  Office procedure. Was  Out of work 3 weeks.  Reduced 6 lbs via liposuction   2) Obesity:  wants pharmacotherapy with Ozempic.  Has type 2 DM ,  working at [employer] as a Clinical research associate .  Has been reducing portion size,  eliminated snacks.  Marland Kitchen  3) cervical disk disease  history of ACDF 2 level in 2015  .  Reports radiculopathy as a dull ache  down her left arm and numbness at night .  No trial of gabapentin .  Prior tendon sheath injections in oct and dec at [facility] with good relief of pain  [name] has  a past medical history of Anxiety, Hypothyroidism, Migraines, PONV (postoperative nausea and vomiting), Seasonal allergies, and Thyroid disease.   She has a past surgical history that includes Spine surgery; Abdominal hysterectomy (May 1995); Shoulder arthroscopy w/ capsular repair (2008); Cholecystectomy; Nasal sinus surgery (Bilateral);  and Anterior cervical decomp/discectomy fusion (N/A, 09/27/2013).   Her family history includes Cancer in her paternal grandfather; Diabetes in her paternal grandmother; Heart disease in her maternal grandfather and maternal grandmother; Hyperlipidemia in her mother; Hypothyroidism in her brother, father, mother, sister, and son; Stroke in her paternal grandmother.She reports that she has never smoked. She has never used smokeless tobacco. She reports that she does not drink alcohol and does not use drugs.  Outpatient Medications Prior to Visit  Medication Sig Dispense Refill   Acetaminophen 500 MG capsule Take by mouth.     albuterol (VENTOLIN HFA) 108 (90 Base) MCG/ACT inhaler Inhale into the lungs.     cholecalciferol (VITAMIN D3) 25 MCG (1000 UNIT) tablet Take 1,000 Units by mouth daily.     Fluticasone-Salmeterol (ADVAIR DISKUS) 250-50 MCG/DOSE AEPB Inhale 1 puff into the lungs 2 (two) times daily. 3 each 3   ibuprofen (ADVIL) 200 MG tablet Take 400 mg by mouth 2 (two) times daily.     levothyroxine (SYNTHROID) 88 MCG tablet TAKE 1 TABLET DAILY 90 tablet 1   losartan-hydrochlorothiazide (HYZAAR) 50-12.5 MG tablet Take 1 tablet by mouth daily. 90 tablet 3   metFORMIN (GLUCOPHAGE-XR) 500 MG 24 hr tablet TAKE 1 TABLET BY MOUTH TWICE A DAY 180 tablet 1   methocarbamol (ROBAXIN) 750 MG tablet Take 1 tablet (750 mg total) by mouth 3 (three) times daily. 270 tablet 1   pantoprazole (PROTONIX) 40 MG tablet TAKE 1 TABLET DAILY 90 tablet 1   triamcinolone (KENALOG) 0.1 % Apply 1 application topically 2 (two) times daily. 30 g 1   venlafaxine XR (EFFEXOR-XR) 75 MG 24 hr capsule TAKE 1 CAPSULE DAILY 90 capsule 1   rosuvastatin (CRESTOR) 10 MG tablet Take 10 mg by mouth at bedtime.     atorvastatin (LIPITOR) 20 MG tablet Take 1 tablet (20 mg total) by mouth daily. 90 tablet 3   metFORMIN (GLUCOPHAGE-XR) 500 MG 24 hr tablet Take 1 tablet (500 mg total) by mouth 2 (two) times daily. 180 tablet 1   metFORMIN  (GLUCOPHAGE-XR) 500 MG 24 hr tablet TAKE 1 TABLET (500 MG TOTAL) BY MOUTH IN THE MORNING AND AT BEDTIME. WITH FOOD 60 tablet 0   methocarbamol (ROBAXIN) 750 MG tablet TAKE 1 TABLET BY MOUTH 3 TIMES DAILY. (Patient not taking: Reported on 07/17/2021) 270 tablet 1   venlafaxine XR (EFFEXOR XR) 150 MG 24 hr capsule Take 1 capsule (150 mg total) by mouth daily with breakfast. (Patient not taking: Reported on 07/17/2021) 90 capsule 1   vitamin B-12 (CYANOCOBALAMIN) 1000 MCG tablet Take 1,000 mcg by mouth daily. (Patient not taking: Reported on 07/17/2021)     No facility-administered medications prior to visit.    Review of Systems  Patient denies headache, fevers, malaise, unintentional weight loss, skin rash, eye pain, sinus congestion and sinus pain, sore throat, dysphagia,  hemoptysis , cough, dyspnea, wheezing, chest pain, palpitations, orthopnea, edema, abdominal pain, nausea, melena, diarrhea, constipation, flank pain, dysuria, hematuria, urinary  Frequency, nocturia, numbness, tingling, seizures,  Focal weakness, Loss of consciousness,  Tremor, insomnia, depression, anxiety, and suicidal ideation.     Objective:  BP 122/82 (BP Location: Right Arm, Patient Position: Sitting, Cuff Size: Large)    Pulse Marland Kitchen)  102    Temp 97.8 F (36.6 C) (Oral)    Ht 4\' 11"  (1.499 m)    Wt 171 lb 12.8 oz (77.9 kg)    SpO2 98%    BMI 34.70 kg/m   Physical Exam  General appearance: alert, cooperative and appears stated age Head: Normocephalic, without obvious abnormality, atraumatic Eyes: conjunctivae/corneas clear. PERRL, EOM's intact. Fundi benign. Ears: normal TM's and external ear canals both ears Nose: Nares normal. Septum midline. Mucosa normal. No drainage or sinus tenderness. Throat: lips, mucosa, and tongue normal; teeth and gums normal Neck: no adenopathy, no carotid bruit, no JVD, supple, symmetrical, trachea midline and thyroid not enlarged, symmetric, no tenderness/mass/nodules Lungs: clear to  auscultation bilaterally Breasts: normal appearance, no masses or tenderness Heart: regular rate and rhythm, S1, S2 normal, no murmur, click, rub or gallop Abdomen: soft, non-tender; bowel sounds normal; no masses,  no organomegaly. Well healed lower abdominal surgical incision  Extremities: extremities normal, atraumatic, no cyanosis or edema Pulses: 2+ and symmetric Skin: Skin color, texture, turgor normal. No rashes or lesions Neurologic: Alert and oriented X 3, normal strength and tone. Normal symmetric reflexes. Normal coordination and gait.    Assessment & Plan:   Problem List Items Addressed This Visit     Obesity (BMI 30-39.9)    Starting ozempic .  counselling given .  No contraindications       Relevant Medications   Semaglutide,0.25 or 0.5MG /DOS, (OZEMPIC, 0.25 OR 0.5 MG/DOSE,) 2 MG/1.5ML SOPN   Hypothyroidism - Primary   Relevant Orders   TSH   Cervical spondylosis with radiculopathy    Dull pain /radiculopathy managed currently with tylenol.  Adding gabapentin       Relevant Medications   gabapentin (NEURONTIN) 100 MG capsule   Obesity, diabetes, and hypertension syndrome (HCC)   Relevant Medications   rosuvastatin (CRESTOR) 10 MG tablet   Semaglutide,0.25 or 0.5MG /DOS, (OZEMPIC, 0.25 OR 0.5 MG/DOSE,) 2 MG/1.5ML SOPN   Other Relevant Orders   Urine Microalbumin w/creat. ratio   Comprehensive metabolic panel   Hemoglobin A1c   Routine general medical examination at a health care facility    age appropriate education and counseling updated, referrals for preventative services and immunizations addressed, dietary and smoking counseling addressed, most recent labs reviewed.  I have personally reviewed and have noted:   1) the patient's medical and social history 2) The pt's use of alcohol, tobacco, and illicit drugs 3) The patient's current medications and supplements 4) Functional ability including ADL's, fall risk, home safety risk, hearing and visual  impairment 5) Diet and physical activities 6) Evidence for depression or mood disorder 7) The patient's height, weight, and BMI have been recorded in the chart   I have made referrals, and provided counseling and education based on review of the above      Hyperlipidemia associated with type 2 diabetes mellitus ([place])    Starting rosuvastatin by cardiology.  Taking metformin and losartan as well.  Adding ozempic today .       Relevant Medications   rosuvastatin (CRESTOR) 10 MG tablet   Semaglutide,0.25 or 0.5MG /DOS, (OZEMPIC, 0.25 OR 0.5 MG/DOSE,) 2 MG/1.5ML SOPN   Other Relevant Orders   Lipid Panel w/reflex Direct LDL   Reflux esophagitis    Continue pantoprazole.        I have discontinued Natasha Pitts. Natasha "[name]"'s vitamin B-12 and atorvastatin. I am also having her start on Ozempic (0.25 or 0.5 MG/DOSE) and gabapentin. Additionally, I am having her maintain her  Acetaminophen, cholecalciferol, ibuprofen, Fluticasone-Salmeterol, albuterol, losartan-hydrochlorothiazide, triamcinolone cream, methocarbamol, metFORMIN, levothyroxine, pantoprazole, venlafaxine XR, and rosuvastatin.  Meds ordered this encounter  Medications   Semaglutide,0.25 or 0.5MG /DOS, (OZEMPIC, 0.25 OR 0.5 MG/DOSE,) 2 MG/1.5ML SOPN    Sig: Inject 0.25 mg into the skin once a week.    Dispense:  1.5 mL    Refill:  2   gabapentin (NEURONTIN) 100 MG capsule    Sig: Take 1 capsule (100 mg total) by mouth 3 (three) times daily.    Dispense:  90 capsule    Refill:  1    Medications Discontinued During This Encounter  Medication Reason   atorvastatin (LIPITOR) 20 MG tablet    metFORMIN (GLUCOPHAGE-XR) 500 MG 24 hr tablet    metFORMIN (GLUCOPHAGE-XR) 500 MG 24 hr tablet    methocarbamol (ROBAXIN) 0000000 MG tablet Duplicate   venlafaxine XR (EFFEXOR XR) 150 MG 24 hr capsule Change in therapy   vitamin B-12 (CYANOCOBALAMIN) 1000 MCG tablet     Follow-up: No follow-ups on file.   Crecencio Mc, MD

## 2021-07-17 NOTE — Assessment & Plan Note (Signed)
Dull pain /radiculopathy managed currently with tylenol.  Adding gabapentin

## 2021-07-17 NOTE — Assessment & Plan Note (Signed)
Starting ozempic .  counselling given .  No contraindications

## 2021-07-17 NOTE — Assessment & Plan Note (Signed)
Continue pantoprazole. °

## 2021-07-17 NOTE — Assessment & Plan Note (Signed)

## 2021-07-17 NOTE — Patient Instructions (Signed)
I am   adding Ozempic  to help you lose weight  and have given you a sample pen that contains 6  WEEKLY doses at the starting dose of 0.25 mg     ozempic is a medication that is taken as a weekly subcutaneous injection. It is not insulin.  It  causes your pancreas to increase its  own insulin secretion  And also slows down the emptying of your stomach,  So it decreases your appetite and helps you lose weight.  The dose for the first 4 weekly doses is 0.25 mg.  You may have mild nausea on the first or second day but this should resolve.  If not  ,  stop the medication.   As long as you are losing weight,  you can continue the dose you are on .  Only increase the dose to 0.5 mg after 4 weeks if your weight has plateaued.  Let me know when you need a refill and what dose you are taking.

## 2021-07-17 NOTE — Assessment & Plan Note (Signed)
Starting rosuvastatin by cardiology.  Taking metformin and losartan as well.  Adding ozempic today .

## 2021-07-18 LAB — COMPREHENSIVE METABOLIC PANEL
ALT: 21 U/L (ref 0–35)
AST: 18 U/L (ref 0–37)
Albumin: 4.4 g/dL (ref 3.5–5.2)
Alkaline Phosphatase: 69 U/L (ref 39–117)
BUN: 14 mg/dL (ref 6–23)
CO2: 27 mEq/L (ref 19–32)
Calcium: 10.1 mg/dL (ref 8.4–10.5)
Chloride: 103 mEq/L (ref 96–112)
Creatinine, Ser: 0.89 mg/dL (ref 0.40–1.20)
GFR: 70.25 mL/min (ref >60.00)
Glucose, Bld: 98 mg/dL (ref 70–99)
Potassium: 3.8 mEq/L (ref 3.5–5.1)
Sodium: 141 mEq/L (ref 135–145)
Total Bilirubin: 0.3 mg/dL (ref 0.2–1.2)
Total Protein: 7 g/dL (ref 6.0–8.3)

## 2021-07-18 LAB — MICROALBUMIN / CREATININE URINE RATIO
Creatinine,U: 230.5 mg/dL
Microalb Creat Ratio: 1.8 mg/g (ref 0.0–30.0)
Microalb, Ur: 4.1 mg/dL — ABNORMAL HIGH (ref 0.0–1.9)

## 2021-07-18 LAB — HEMOGLOBIN A1C: Hgb A1c MFr Bld: 6.5 % (ref 4.6–6.5)

## 2021-07-18 LAB — TSH: TSH: 4.18 u[IU]/mL (ref 0.35–5.50)

## 2021-07-20 ENCOUNTER — Encounter: Payer: Self-pay | Admitting: Internal Medicine

## 2021-07-20 DIAGNOSIS — E1169 Type 2 diabetes mellitus with other specified complication: Secondary | ICD-10-CM

## 2021-07-24 MED ORDER — LEVOTHYROXINE SODIUM 100 MCG PO TABS: 100.0000 ug | ORAL_TABLET | Freq: Every day | ORAL | 1 refills | Status: DC

## 2021-08-05 ENCOUNTER — Other Ambulatory Visit: Payer: Self-pay | Admitting: Internal Medicine

## 2021-08-14 ENCOUNTER — Telehealth: Payer: Self-pay | Admitting: Internal Medicine

## 2021-08-14 ENCOUNTER — Other Ambulatory Visit: Payer: Self-pay | Admitting: Internal Medicine

## 2021-08-14 DIAGNOSIS — E1159 Type 2 diabetes mellitus with other circulatory complications: Secondary | ICD-10-CM

## 2021-08-14 NOTE — Telephone Encounter (Signed)
Pt called in requesting lab orders and urinalysis. Advise pt she have a future CMP order. No urinalysis order in system. Schedule pt for 3 months follow up in April 19,2023 at 9am.

## 2021-08-14 NOTE — Telephone Encounter (Signed)
LMTCB. Need to find out why pt is needing to urinalysis, UTI symptoms, some other symptoms or just wanting to have one checked.

## 2021-08-15 NOTE — Telephone Encounter (Signed)
Pt returning call

## 2021-08-16 NOTE — Telephone Encounter (Signed)
Confirmed with patient that she is not having any acute issues. Would just like to having urine checked for her appt in April. Ok to add urine to CMP that is ordered?

## 2021-08-16 NOTE — Addendum Note (Signed)
Addended by: Lars Masson on: 08/16/2021 02:47 PM   Modules accepted: Orders

## 2021-08-20 ENCOUNTER — Other Ambulatory Visit: Payer: Self-pay | Admitting: Internal Medicine

## 2021-08-20 DIAGNOSIS — E669 Obesity, unspecified: Secondary | ICD-10-CM

## 2021-08-20 DIAGNOSIS — E1159 Type 2 diabetes mellitus with other circulatory complications: Secondary | ICD-10-CM

## 2021-09-06 ENCOUNTER — Other Ambulatory Visit: Payer: Self-pay | Admitting: Internal Medicine

## 2021-10-09 ENCOUNTER — Encounter: Payer: Self-pay | Admitting: Internal Medicine

## 2021-10-09 ENCOUNTER — Ambulatory Visit (INDEPENDENT_AMBULATORY_CARE_PROVIDER_SITE_OTHER): Payer: BC Managed Care – PPO

## 2021-10-09 ENCOUNTER — Ambulatory Visit: Payer: BC Managed Care – PPO | Admitting: Internal Medicine

## 2021-10-09 ENCOUNTER — Other Ambulatory Visit: Payer: Self-pay | Admitting: Internal Medicine

## 2021-10-09 VITALS — BP 110/72 | HR 82 | Temp 97.5°F | Ht 59.0 in | Wt 159.6 lb

## 2021-10-09 DIAGNOSIS — N200 Calculus of kidney: Secondary | ICD-10-CM | POA: Diagnosis not present

## 2021-10-09 DIAGNOSIS — I152 Hypertension secondary to endocrine disorders: Secondary | ICD-10-CM

## 2021-10-09 DIAGNOSIS — E669 Obesity, unspecified: Secondary | ICD-10-CM

## 2021-10-09 DIAGNOSIS — Z87442 Personal history of urinary calculi: Secondary | ICD-10-CM | POA: Diagnosis not present

## 2021-10-09 DIAGNOSIS — E785 Hyperlipidemia, unspecified: Secondary | ICD-10-CM

## 2021-10-09 DIAGNOSIS — E1159 Type 2 diabetes mellitus with other circulatory complications: Secondary | ICD-10-CM

## 2021-10-09 DIAGNOSIS — E1169 Type 2 diabetes mellitus with other specified complication: Secondary | ICD-10-CM | POA: Diagnosis not present

## 2021-10-09 MED ORDER — SEMAGLUTIDE (2 MG/DOSE) 8 MG/3ML ~~LOC~~ SOPN: 2.0000 mg | PEN_INJECTOR | SUBCUTANEOUS | 1 refills | Status: DC

## 2021-10-09 NOTE — Assessment & Plan Note (Addendum)
Currently well-controlled on ozempic and metformin .   hemoglobin A1c is at goal of less than 7.0 . Patient is reminded to schedule an annual eye exam and foot exam is normal today. Patient has no microalbuminuria. Patient is taking Crestor for  CAD risk reduction and is taking an  to ARB for renal protection and hypertension  ? ?Lab Results  ?Component Value Date  ? HGBA1C 6.5 07/17/2021  ? ?Lab Results  ?Component Value Date  ? CHOL 244 (H) 07/17/2021  ? HDL 44 (L) 07/17/2021  ? LDLCALC 142 (H) 07/17/2021  ? LDLDIRECT 138.0 12/07/2019  ? TRIG 375 (H) 07/17/2021  ? CHOLHDL 5.5 (H) 07/17/2021  ? ? ?

## 2021-10-09 NOTE — Progress Notes (Signed)
? ?Subjective:  ?Patient ID: Natasha Pitts, female    DOB: September 21, 1960  Age: 61 y.o. MRN: [id number] ? ?CC: The primary encounter diagnosis was Obesity, diabetes, and hypertension syndrome (HCC). Diagnoses of Hyperlipidemia associated with type 2 diabetes mellitus (HCC), Right nephrolithiasis, Obesity (BMI 30-39.9), and History of nephrolithiasis were also pertinent to this visit. ? ? ?This visit occurred during the SARS-CoV-2 public health emergency.  Safety protocols were in place, including screening questions prior to the visit, additional usage of staff PPE, and extensive cleaning of exam room while observing appropriate contact time as indicated for disinfecting solutions.   ? ?HPI ?Natasha Hooverarolyn Dean Pitts presents for  ?Chief Complaint  ?Patient presents with  ? Follow-up  ?  3 month follow up   ? ?1) Obesity/diabetes/hypertension : She has lost  13 lbs since January start of Ozempic .  She has been gradually increasing her dose monthly and  one dose of 0.75  mg last week .  She has had no adverse side effects.  ? Checking  blood sugars less than once daily at variable times, usually only if she feels she may be having a hypoglycemic event. .  BS have been under 130 fasting and < 150 post prandially.  Denies any recent hypoglyemic events.  Taking   medications as directed. Following a carbohydrate modified diet 6 days per week. Denies numbness, burning and tingling of extremities. Appetite is good.    ? ?2) NEPHROLITHIASIS:  NEEDS KUB AND REFERRAL TO [facility] ? ?3) CARDIAC CT REQUESTEDTO BE DONE AT [facility] ($99 COST) ? ? ?Lab Results  ?Component Value Date  ? HGBA1C 6.5 07/17/2021  ? ? ? ?Outpatient Medications Prior to Visit  ?Medication Sig Dispense Refill  ? Acetaminophen 500 MG capsule Take by mouth.    ? albuterol (VENTOLIN HFA) 108 (90 Base) MCG/ACT inhaler Inhale into the lungs.    ? cholecalciferol (VITAMIN D3) 25 MCG (1000 UNIT) tablet Take 1,000 Units by mouth daily.    ?  Fluticasone-Salmeterol (ADVAIR DISKUS) 250-50 MCG/DOSE AEPB Inhale 1 puff into the lungs 2 (two) times daily. 3 each 3  ? gabapentin (NEURONTIN) 100 MG capsule Take 1 capsule (100 mg total) by mouth 3 (three) times daily. 90 capsule 1  ? ibuprofen (ADVIL) 200 MG tablet Take 400 mg by mouth 2 (two) times daily.    ? levothyroxine (SYNTHROID) 100 MCG tablet Take 1 tablet (100 mcg total) by mouth daily. 90 tablet 1  ? losartan-hydrochlorothiazide (HYZAAR) 50-12.5 MG tablet Take 1 tablet by mouth daily 90 tablet 3  ? metFORMIN (GLUCOPHAGE-XR) 500 MG 24 hr tablet TAKE 1 TABLET BY MOUTH TWICE A DAY 180 tablet 1  ? methocarbamol (ROBAXIN) 750 MG tablet TAKE 1 TABLET BY MOUTH THREE TIMES A DAY 270 tablet 1  ? pantoprazole (PROTONIX) 40 MG tablet TAKE 1 TABLET DAILY 90 tablet 1  ? rosuvastatin (CRESTOR) 10 MG tablet Take 10 mg by mouth at bedtime.    ? tretinoin (RETIN-A) 0.025 % cream SMARTSIG:sparingly Topical Every Night    ? triamcinolone cream (KENALOG) 0.1 % Apply 1 application topically twice daily 30 g 1  ? venlafaxine XR (EFFEXOR-XR) 75 MG 24 hr capsule TAKE 1 CAPSULE DAILY 90 capsule 1  ? OZEMPIC, 0.25 OR 0.5 MG/DOSE, 2 MG/3ML SOPN SMARTSIG:0.25 Milligram(s) SUB-Q Once a Week    ? Semaglutide,0.25 or 0.5MG /DOS, (OZEMPIC, 0.25 OR 0.5 MG/DOSE,) 2 MG/1.5ML SOPN Inject 0.25 mg into the skin once a week. (Patient not taking: Reported  on 10/09/2021) 1.5 mL 2  ? ?No facility-administered medications prior to visit.  ? ? ?Review of Systems; ? ?Patient denies headache, fevers, malaise, unintentional weight loss, skin rash, eye pain, sinus congestion and sinus pain, sore throat, dysphagia,  hemoptysis , cough, dyspnea, wheezing, chest pain, palpitations, orthopnea, edema, abdominal pain, nausea, melena, diarrhea, constipation, flank pain, dysuria, hematuria, urinary  Frequency, nocturia, numbness, tingling, seizures,  Focal weakness, Loss of consciousness,  Tremor, insomnia, depression, anxiety, and suicidal ideation.    ? ? ? ?Objective:  ?BP 110/72 (BP Location: Left Arm, Patient Position: Sitting, Cuff Size: Large)   Pulse 82   Temp (!) 97.5 ?F (36.4 ?C) (Oral)   Ht 4\' 11"  (1.499 m)   Wt 159 lb 9.6 oz (72.4 kg)   SpO2 97%   BMI 32.24 kg/m?  ? ?BP Readings from Last 3 Encounters:  ?10/09/21 110/72  ?07/17/21 122/82  ?08/13/20 124/82  ? ? ?Wt Readings from Last 3 Encounters:  ?10/09/21 159 lb 9.6 oz (72.4 kg)  ?07/17/21 171 lb 12.8 oz (77.9 kg)  ?08/13/20 169 lb 3.2 oz (76.7 kg)  ? ? ?General appearance: alert, cooperative and appears stated age ?Ears: normal TM's and external ear canals both ears ?Throat: lips, mucosa, and tongue normal; teeth and gums normal ?Neck: no adenopathy, no carotid bruit, supple, symmetrical, trachea midline and thyroid not enlarged, symmetric, no tenderness/mass/nodules ?Back: symmetric, no curvature. ROM normal. No CVA tenderness. ?Lungs: clear to auscultation bilaterally ?Heart: regular rate and rhythm, S1, S2 normal, no murmur, click, rub or gallop ?Abdomen: soft, non-tender; bowel sounds normal; no masses,  no organomegaly ?Pulses: 2+ and symmetric ?Skin: Skin color, texture, turgor normal. No rashes or lesions ?Lymph nodes: Cervical, supraclavicular, and axillary nodes normal. ? ?Lab Results  ?Component Value Date  ? HGBA1C 6.5 07/17/2021  ? HGBA1C 6.0 08/13/2020  ? HGBA1C 6.3 12/07/2019  ? ? ?Lab Results  ?Component Value Date  ? CREATININE 0.89 07/17/2021  ? CREATININE 0.79 08/13/2020  ? CREATININE 0.86 12/07/2019  ? ? ?Lab Results  ?Component Value Date  ? WBC 4.4 09/19/2013  ? HGB 13.2 09/19/2013  ? HCT 38.3 09/19/2013  ? PLT 278 09/19/2013  ? GLUCOSE 98 07/17/2021  ? CHOL 244 (H) 07/17/2021  ? TRIG 375 (H) 07/17/2021  ? HDL 44 (L) 07/17/2021  ? LDLDIRECT 138.0 12/07/2019  ? LDLCALC 142 (H) 07/17/2021  ? ALT 21 07/17/2021  ? AST 18 07/17/2021  ? NA 141 07/17/2021  ? K 3.8 07/17/2021  ? CL 103 07/17/2021  ? CREATININE 0.89 07/17/2021  ? BUN 14 07/17/2021  ? CO2 27 07/17/2021  ? TSH 4.18  07/17/2021  ? HGBA1C 6.5 07/17/2021  ? MICROALBUR 4.1 (H) 07/17/2021  ? ? ?No results found. ? ?Assessment & Plan:  ? ?Problem List Items Addressed This Visit   ? ? Obesity, diabetes, and hypertension syndrome (HCC) - Primary  ?  Currently well-controlled on ozempic and metformin .   hemoglobin A1c is at goal of less than 7.0 . Patient is reminded to schedule an annual eye exam and foot exam is normal today. Patient has no microalbuminuria. Patient is taking Crestor for  CAD risk reduction and is taking an  to ARB for renal protection and hypertension  ? ?Lab Results  ?Component Value Date  ? HGBA1C 6.5 07/17/2021  ? ?Lab Results  ?Component Value Date  ? CHOL 244 (H) 07/17/2021  ? HDL 44 (L) 07/17/2021  ? LDLCALC 142 (H) 07/17/2021  ? LDLDIRECT 138.0 12/07/2019  ?  TRIG 375 (H) 07/17/2021  ? CHOLHDL 5.5 (H) 07/17/2021  ? ? ?  ?  ? Relevant Medications  ? Semaglutide, 2 MG/DOSE, 8 MG/3ML SOPN  ? Other Relevant Orders  ? HgB A1c  ? CT CARDIAC SCORING (SELF PAY ONLY)  ? Hyperlipidemia associated with type 2 diabetes mellitus (HCC)  ? Relevant Medications  ? Semaglutide, 2 MG/DOSE, 8 MG/3ML SOPN  ? Other Relevant Orders  ? Lipid Profile  ? Direct LDL  ? Obesity (BMI 30-39.9)  ?  She is losing weight with Ozempic and metformin  without side effects. Continue 0.75 mg weekly x 3 more weeks , then increase dose to 1 mg daily  ? ?  ?  ? Relevant Medications  ? Semaglutide, 2 MG/DOSE, 8 MG/3ML SOPN  ? History of nephrolithiasis  ?  Annual KUB is due  ? ?  ?  ? ?Other Visit Diagnoses   ? ? Right nephrolithiasis      ? Relevant Orders  ? DG Abd 1 View  ? ?  ? ?  ? ?Follow-up: No follow-ups on file. ? ? ?Sherlene Shams, MD ?

## 2021-10-09 NOTE — Telephone Encounter (Signed)
Catlin from Pharmacy called about ozempic. Catlin said the ozempic has changed from .25 mg and goes to 2mg . Se wanted to know was that correct ?

## 2021-10-09 NOTE — Patient Instructions (Addendum)
Continue 0.75 mg dose for a total OF 4 weeks . Use the conversion table to deliver the right amount with the new pen that I sent in  ? ?IT SHOULD BE 29 CLICKS FOR THE 0.75 MG DOSE  ? ?IF YOU GET THE LABS DONE IN LATE April OR MAY, YOU DO NOT NEED TO SEE ME FOR 6 MONTHS ? ?IF YOU DECIDE TO WAIT UNTIL NEXT VISIT , RETURN IN 3  ? ? ? ? ?

## 2021-10-09 NOTE — Assessment & Plan Note (Addendum)
She is losing weight with Ozempic and metformin  without side effects. Continue 0.75 mg weekly x 3 more weeks , then increase dose to 1 mg daily  ?

## 2021-10-09 NOTE — Assessment & Plan Note (Signed)
Annual KUB is due  ?

## 2021-10-10 MED ORDER — SEMAGLUTIDE (1 MG/DOSE) 4 MG/3ML ~~LOC~~ SOPN: 1.0000 mg | PEN_INJECTOR | SUBCUTANEOUS | 2 refills | Status: DC

## 2021-10-10 NOTE — Telephone Encounter (Signed)
Spoke with pharmacist and they will not dispense the 2 mg pen to pt because she has not been on the 1 mg dose yet. She stated that we will need to send in a new rx for the 1 mg dose pen. I have pended this for approval.  ?

## 2021-10-15 LAB — HM DIABETES EYE EXAM

## 2021-10-16 ENCOUNTER — Other Ambulatory Visit: Payer: Self-pay | Admitting: Internal Medicine

## 2021-10-17 ENCOUNTER — Telehealth: Payer: Self-pay | Admitting: Internal Medicine

## 2021-10-17 NOTE — Telephone Encounter (Signed)
Patient called and would like order for CT Calcium Scoring sent to [facility]. Fax # is [phone]. ?

## 2021-10-17 NOTE — Telephone Encounter (Signed)
Order has been placed. Does this need to be approved through insurance or can I just print order and fax?  ?

## 2021-11-02 ENCOUNTER — Encounter: Payer: Self-pay | Admitting: Internal Medicine

## 2021-11-06 ENCOUNTER — Other Ambulatory Visit: Payer: BC Managed Care – PPO

## 2021-12-06 ENCOUNTER — Other Ambulatory Visit: Payer: Self-pay

## 2021-12-06 MED ORDER — SEMAGLUTIDE (1 MG/DOSE) 4 MG/3ML ~~LOC~~ SOPN: 1.0000 mg | PEN_INJECTOR | SUBCUTANEOUS | 2 refills | Status: DC

## 2021-12-23 ENCOUNTER — Telehealth: Payer: Self-pay

## 2021-12-23 MED ORDER — VENLAFAXINE HCL ER 75 MG PO CP24: 75.0000 mg | ORAL_CAPSULE | Freq: Every day | ORAL | 1 refills | Status: DC

## 2021-12-23 MED ORDER — PANTOPRAZOLE SODIUM 40 MG PO TBEC: 40.0000 mg | DELAYED_RELEASE_TABLET | Freq: Every day | ORAL | 1 refills | Status: DC

## 2021-12-23 NOTE — Telephone Encounter (Signed)
Patient states she has called [facility] and it's a mess.  Patient states every time she calls them, they tell her she has no refills.  Patient states she would like to have the following medications refilled, and would like for [facility] [place]:  pantoprazole (PROTONIX) 40 MG tablet  venlafaxine XR (EFFEXOR-XR) 75 MG 24 hr capsule  Patient said we can call her if we have any questions.

## 2021-12-23 NOTE — Telephone Encounter (Signed)
Medications have been refilled ?

## 2022-01-06 ENCOUNTER — Other Ambulatory Visit: Payer: Self-pay | Admitting: Internal Medicine

## 2022-01-08 ENCOUNTER — Telehealth: Payer: Self-pay

## 2022-01-08 NOTE — Telephone Encounter (Signed)
LMTCB. Need to find out what dose of Ozempic the pt is currently taking.  

## 2022-01-13 MED ORDER — SEMAGLUTIDE (2 MG/DOSE) 8 MG/3ML ~~LOC~~ SOPN: 2.0000 mg | PEN_INJECTOR | SUBCUTANEOUS | 1 refills | Status: DC

## 2022-01-13 NOTE — Addendum Note (Signed)
Addended by: Sandy Salaam on: 01/13/2022 01:53 PM   Modules accepted: Orders

## 2022-01-13 NOTE — Telephone Encounter (Signed)
Medication has been refilled.

## 2022-01-13 NOTE — Telephone Encounter (Signed)
Pt stated that she is taking 2.4 of the ozempic.Marland KitchenMarland KitchenMarland Kitchen

## 2022-01-14 ENCOUNTER — Other Ambulatory Visit: Payer: Self-pay | Admitting: Internal Medicine

## 2022-01-15 ENCOUNTER — Ambulatory Visit: Payer: BC Managed Care – PPO | Admitting: Internal Medicine

## 2022-01-15 ENCOUNTER — Other Ambulatory Visit: Payer: Self-pay | Admitting: Internal Medicine

## 2022-02-19 ENCOUNTER — Ambulatory Visit: Payer: BC Managed Care – PPO | Admitting: Internal Medicine

## 2022-02-19 ENCOUNTER — Encounter: Payer: Self-pay | Admitting: Internal Medicine

## 2022-02-19 VITALS — BP 118/72 | HR 87 | Temp 98.1°F | Ht 59.0 in | Wt 150.0 lb

## 2022-02-19 DIAGNOSIS — E785 Hyperlipidemia, unspecified: Secondary | ICD-10-CM | POA: Diagnosis not present

## 2022-02-19 DIAGNOSIS — I152 Hypertension secondary to endocrine disorders: Secondary | ICD-10-CM | POA: Diagnosis not present

## 2022-02-19 DIAGNOSIS — Z87898 Personal history of other specified conditions: Secondary | ICD-10-CM | POA: Diagnosis not present

## 2022-02-19 DIAGNOSIS — E1159 Type 2 diabetes mellitus with other circulatory complications: Secondary | ICD-10-CM | POA: Diagnosis not present

## 2022-02-19 DIAGNOSIS — E1169 Type 2 diabetes mellitus with other specified complication: Secondary | ICD-10-CM

## 2022-02-19 DIAGNOSIS — Z87442 Personal history of urinary calculi: Secondary | ICD-10-CM

## 2022-02-19 DIAGNOSIS — E669 Obesity, unspecified: Secondary | ICD-10-CM

## 2022-02-19 DIAGNOSIS — E034 Atrophy of thyroid (acquired): Secondary | ICD-10-CM

## 2022-02-19 LAB — COMPREHENSIVE METABOLIC PANEL
ALT: 18 U/L (ref 0–35)
AST: 16 U/L (ref 0–37)
Albumin: 4.1 g/dL (ref 3.5–5.2)
Alkaline Phosphatase: 90 U/L (ref 39–117)
BUN: 17 mg/dL (ref 6–23)
CO2: 28 mEq/L (ref 19–32)
Calcium: 9.7 mg/dL (ref 8.4–10.5)
Chloride: 104 mEq/L (ref 96–112)
Creatinine, Ser: 0.69 mg/dL (ref 0.40–1.20)
GFR: 93.65 mL/min (ref >60.00)
Glucose, Bld: 130 mg/dL — ABNORMAL HIGH (ref 70–99)
Potassium: 3.9 mEq/L (ref 3.5–5.1)
Sodium: 140 mEq/L (ref 135–145)
Total Bilirubin: 0.5 mg/dL (ref 0.2–1.2)
Total Protein: 6.6 g/dL (ref 6.0–8.3)

## 2022-02-19 LAB — LDL CHOLESTEROL, DIRECT: Direct LDL: 75 mg/dL

## 2022-02-19 LAB — LIPID PANEL
Cholesterol: 150 mg/dL (ref 0–200)
HDL: 50.7 mg/dL (ref >39.00)
LDL Cholesterol: 61 mg/dL (ref 0–99)
NonHDL: 99
Total CHOL/HDL Ratio: 3
Triglycerides: 188 mg/dL — ABNORMAL HIGH (ref 0.0–149.0)
VLDL: 37.6 mg/dL (ref 0.0–40.0)

## 2022-02-19 LAB — TSH: TSH: 0.01 u[IU]/mL — ABNORMAL LOW (ref 0.35–5.50)

## 2022-02-19 LAB — HEMOGLOBIN A1C: Hgb A1c MFr Bld: 5.9 % (ref 4.6–6.5)

## 2022-02-19 LAB — HM MAMMOGRAPHY

## 2022-02-19 MED ORDER — ZOSTER VAC RECOMB ADJUVANTED 50 MCG/0.5ML IM SUSR
0.5000 mL | Freq: Once | INTRAMUSCULAR | 1 refills | Status: AC
Start: 2022-02-19 — End: 2022-02-19

## 2022-02-19 MED ORDER — HYDROCHLOROTHIAZIDE 12.5 MG PO CAPS: 12.5000 mg | ORAL_CAPSULE | Freq: Every day | ORAL | 1 refills | Status: DC

## 2022-02-19 MED ORDER — LOSARTAN POTASSIUM 50 MG PO TABS: 50.0000 mg | ORAL_TABLET | Freq: Every day | ORAL | 1 refills | Status: DC

## 2022-02-19 MED ORDER — ZOSTER VAC RECOMB ADJUVANTED 50 MCG/0.5ML IM SUSR: 0.5000 mL | Freq: Once | INTRAMUSCULAR | 1 refills | Status: DC

## 2022-02-19 NOTE — Assessment & Plan Note (Signed)
Managed with Crestor and ozempic (using 1 mg weekly)

## 2022-02-19 NOTE — Assessment & Plan Note (Signed)
No recurrence in 3 years.  8 mm stone passed in 2020

## 2022-02-19 NOTE — Progress Notes (Addendum)
Subjective:  Patient ID: Natasha Pitts, female    DOB: 1961/05/01  Age: 61 y.o. MRN: [id number]  CC: The primary encounter diagnosis was Obesity, diabetes, and hypertension syndrome ([place]). Diagnoses of Hypothyroidism due to acquired atrophy of thyroid, Hyperlipidemia associated with type 2 diabetes mellitus ([place]), Hx of solitary pulmonary nodule, and History of nephrolithiasis were also pertinent to this visit.   HPI Natasha Pitts presents for follow up on obesity with diabetes, hypertension and hyperlipiemia  Chief Complaint  Patient presents with   Follow-up    6 month follow up on diabetes, hypertension, hyperlipidemia, hypothyroidism.   1) Diabetes/ obesity:  she has achieved a 25 lb weight loss using Ozempic .  She has set a  Goal weight of  140. She is currently using Ozempic  1 mg weekly along with metformin.  She is Not exercising daily  but plans to increase with the help of a walking buddy.  Not checking sugars.  No hypoglycemic symptoms.  She is tolerating metformin, losartan and rosuvastatin  ) n  2) Hypertension:  taking losartan/hct.  B has been as low as 93/53 at times.  None above 130/80   Outpatient Medications Prior to Visit  Medication Sig Dispense Refill   Acetaminophen 500 MG capsule Take by mouth.     albuterol (VENTOLIN HFA) 108 (90 Base) MCG/ACT inhaler Inhale into the lungs.     cholecalciferol (VITAMIN D3) 25 MCG (1000 UNIT) tablet Take 1,000 Units by mouth daily.     Fluticasone-Salmeterol (ADVAIR DISKUS) 250-50 MCG/DOSE AEPB Inhale 1 puff into the lungs 2 (two) times daily. 3 each 3   gabapentin (NEURONTIN) 100 MG capsule TAKE 1 CAPSULE (100 MG TOTAL) BY MOUTH 3 TIMES A DAY 90 capsule 1   ibuprofen (ADVIL) 200 MG tablet Take 400 mg by mouth 2 (two) times daily.     levothyroxine (SYNTHROID) 100 MCG tablet TAKE 1 TABLET BY MOUTH EVERY DAY 90 tablet 1   metFORMIN (GLUCOPHAGE-XR) 500 MG 24 hr tablet TAKE 1 TABLET BY MOUTH TWICE A DAY 180 tablet 1    methocarbamol (ROBAXIN) 750 MG tablet TAKE 1 TABLET BY MOUTH THREE TIMES A DAY (Patient taking differently: Take 750 mg by mouth 2 (two) times daily.) 270 tablet 1   pantoprazole (PROTONIX) 40 MG tablet TAKE 1 TABLET DAILY 90 tablet 1   rosuvastatin (CRESTOR) 10 MG tablet Take 10 mg by mouth at bedtime.     Semaglutide, 2 MG/DOSE, 8 MG/3ML SOPN Inject 2 mg as directed once a week. 3 mL 1   tretinoin (RETIN-A) 0.025 % cream SMARTSIG:sparingly Topical Every Night     triamcinolone cream (KENALOG) 0.1 % Apply 1 application topically twice daily 30 g 1   venlafaxine XR (EFFEXOR-XR) 75 MG 24 hr capsule Take 1 capsule (75 mg total) by mouth daily. 90 capsule 1   losartan-hydrochlorothiazide (HYZAAR) 50-12.5 MG tablet Take 1 tablet by mouth daily 90 tablet 3   OZEMPIC, 1 MG/DOSE, 4 MG/3ML SOPN Inject 1 mg into the skin once a week.     No facility-administered medications prior to visit.    Review of Systems;  Patient denies headache, fevers, malaise, unintentional weight loss, skin rash, eye pain, sinus congestion and sinus pain, sore throat, dysphagia,  hemoptysis , cough, dyspnea, wheezing, chest pain, palpitations, orthopnea, edema, abdominal pain, nausea, melena, diarrhea, constipation, flank pain, dysuria, hematuria, urinary  Frequency, nocturia, numbness, tingling, seizures,  Focal weakness, Loss of consciousness,  Tremor, insomnia, depression, anxiety, and suicidal ideation.  Objective:  BP 118/72 (BP Location: Left Arm, Patient Position: Sitting, Cuff Size: Normal)   Pulse 87   Temp 98.1 F (36.7 C) (Oral)   Ht '4\' 11"'  (1.499 m)   Wt 150 lb (68 kg)   SpO2 97%   BMI 30.30 kg/m   BP Readings from Last 3 Encounters:  02/19/22 118/72  10/09/21 110/72  07/17/21 122/82    Wt Readings from Last 3 Encounters:  02/19/22 150 lb (68 kg)  10/09/21 159 lb 9.6 oz (72.4 kg)  07/17/21 171 lb 12.8 oz (77.9 kg)    General appearance: alert, cooperative and appears stated age Ears:  normal TM's and external ear canals both ears Throat: lips, mucosa, and tongue normal; teeth and gums normal Neck: no adenopathy, no carotid bruit, supple, symmetrical, trachea midline and thyroid not enlarged, symmetric, no tenderness/mass/nodules Back: symmetric, no curvature. ROM normal. No CVA tenderness. Lungs: clear to auscultation bilaterally Heart: regular rate and rhythm, S1, S2 normal, no murmur, click, rub or gallop Abdomen: soft, non-tender; bowel sounds normal; no masses,  no organomegaly Pulses: 2+ and symmetric Skin: Skin color, texture, turgor normal. No rashes or lesions Lymph nodes: Cervical, supraclavicular, and axillary nodes normal.  Lab Results  Component Value Date   HGBA1C 5.9 02/19/2022   HGBA1C 6.5 07/17/2021   HGBA1C 6.0 08/13/2020    Lab Results  Component Value Date   CREATININE 0.69 02/19/2022   CREATININE 0.89 07/17/2021   CREATININE 0.79 08/13/2020    Lab Results  Component Value Date   WBC 4.4 09/19/2013   HGB 13.2 09/19/2013   HCT 38.3 09/19/2013   PLT 278 09/19/2013   GLUCOSE 130 (H) 02/19/2022   CHOL 150 02/19/2022   TRIG 188.0 (H) 02/19/2022   HDL 50.70 02/19/2022   LDLDIRECT 75.0 02/19/2022   LDLCALC 61 02/19/2022   ALT 18 02/19/2022   AST 16 02/19/2022   NA 140 02/19/2022   K 3.9 02/19/2022   CL 104 02/19/2022   CREATININE 0.69 02/19/2022   BUN 17 02/19/2022   CO2 28 02/19/2022   TSH 0.01 (L) 02/19/2022   HGBA1C 5.9 02/19/2022   MICROALBUR 4.1 (H) 07/17/2021    No results found.  Assessment & Plan:   Problem List Items Addressed This Visit     History of nephrolithiasis    No recurrence in 3 years.  8 mm stone passed in 2020      Hx of solitary pulmonary nodule   Hyperlipidemia associated with type 2 diabetes mellitus ([place])    Managed with Crestor and ozempic (using 1 mg weekly)       Relevant Medications   losartan (COZAAR) 50 MG tablet   hydrochlorothiazide (MICROZIDE) 12.5 MG capsule   Other Relevant Orders    Lipid Profile (Completed)   Direct LDL (Completed)   Hypothyroidism    TSH is suppressed on 100 mcg levothyroxine  Dose reduce to 88 mcg.  Advised to use up her 100 mcg tablets but reduce to 6 days per week       Relevant Orders   TSH (Completed)   TSH   Obesity, diabetes, and hypertension syndrome ([place]) - Primary    She is managing her diabetes and obesity with Ozempic and metformin .  and has lost 25 lbs  So far.  encouraged to exercise more regularly.  bp has been soft at times;  I have separated the losartan form the hctz to avoid hypotension       Relevant Medications   losartan (  COZAAR) 50 MG tablet   hydrochlorothiazide (MICROZIDE) 12.5 MG capsule   Other Relevant Orders   HgB A1c (Completed)   Comp Met (CMET) (Completed)  Follow-up: Return in about 6 months (around 08/21/2022).   Crecencio Mc, MD

## 2022-02-19 NOTE — Assessment & Plan Note (Signed)
She is managing her diabetes and obesity with Ozempic and metformin .  and has lost 25 lbs  So far.  encouraged to exercise more regularly.  bp has been soft at times;  I have separated the losartan form the hctz to avoid hypotension

## 2022-02-23 NOTE — Addendum Note (Signed)
Addended by: Sherlene Shams on: 02/23/2022 05:53 PM   Modules accepted: Orders

## 2022-02-23 NOTE — Assessment & Plan Note (Signed)
TSH is suppressed on 100 mcg levothyroxine  Dose reduce to 88 mcg.  Advised to use up her 100 mcg tablets but reduce to 6 days per week

## 2022-03-12 ENCOUNTER — Other Ambulatory Visit: Payer: Self-pay | Admitting: Internal Medicine

## 2022-04-09 ENCOUNTER — Other Ambulatory Visit (INDEPENDENT_AMBULATORY_CARE_PROVIDER_SITE_OTHER): Payer: BC Managed Care – PPO

## 2022-04-09 DIAGNOSIS — E034 Atrophy of thyroid (acquired): Secondary | ICD-10-CM | POA: Diagnosis not present

## 2022-04-09 LAB — TSH: TSH: 0.1 u[IU]/mL — ABNORMAL LOW (ref 0.35–5.50)

## 2022-04-10 MED ORDER — LEVOTHYROXINE SODIUM 75 MCG PO TABS: 75.0000 ug | ORAL_TABLET | Freq: Every day | ORAL | 3 refills | Status: DC

## 2022-04-10 NOTE — Addendum Note (Signed)
Addended by: Crecencio Mc on: 04/10/2022 05:09 PM   Modules accepted: Orders

## 2022-04-10 NOTE — Assessment & Plan Note (Signed)
Thyroid function is still overactive on current dose of 88 mcg daily   I have sent a lower dose of levothyroxine to her pharmacy and would like patient to change immediatley.

## 2022-05-07 ENCOUNTER — Ambulatory Visit: Payer: BC Managed Care – PPO | Admitting: Internal Medicine

## 2022-05-16 ENCOUNTER — Other Ambulatory Visit: Payer: Self-pay | Admitting: Internal Medicine

## 2022-05-31 ENCOUNTER — Other Ambulatory Visit: Payer: Self-pay | Admitting: Family

## 2022-06-04 ENCOUNTER — Encounter: Payer: Self-pay | Admitting: Internal Medicine

## 2022-06-04 DIAGNOSIS — E669 Obesity, unspecified: Secondary | ICD-10-CM

## 2022-06-04 NOTE — Telephone Encounter (Signed)
MyChart messgae sent to patient. 

## 2022-06-05 MED ORDER — TRIAMCINOLONE ACETONIDE 0.1 % EX CREA: TOPICAL_CREAM | CUTANEOUS | 1 refills | Status: DC

## 2022-06-05 NOTE — Telephone Encounter (Signed)
Pt is requesting a refill of Ozempic 4 mg. Does Ozempic come as a 4 mg dose?

## 2022-06-10 MED ORDER — SEMAGLUTIDE (1 MG/DOSE) 4 MG/3ML ~~LOC~~ SOPN: 1.0000 mg | PEN_INJECTOR | SUBCUTANEOUS | 2 refills | Status: DC

## 2022-06-10 NOTE — Addendum Note (Signed)
Addended by: Sherlene Shams on: 06/10/2022 12:48 PM   Modules accepted: Orders

## 2022-06-10 NOTE — Assessment & Plan Note (Signed)
Refilling Ozempic at 1 mg dose per patient request

## 2022-06-18 ENCOUNTER — Other Ambulatory Visit: Payer: Self-pay | Admitting: Internal Medicine

## 2022-08-03 ENCOUNTER — Other Ambulatory Visit: Payer: Self-pay | Admitting: Internal Medicine

## 2022-08-11 IMAGING — DX DG ABDOMEN 1V
1 series · 1 of 1 positions shown · non-contrast
Comparison: None.

CLINICAL DATA: History of nephrolithiasis on the right.

EXAM:
ABDOMEN - 1 VIEW

[abdomen standing ap]
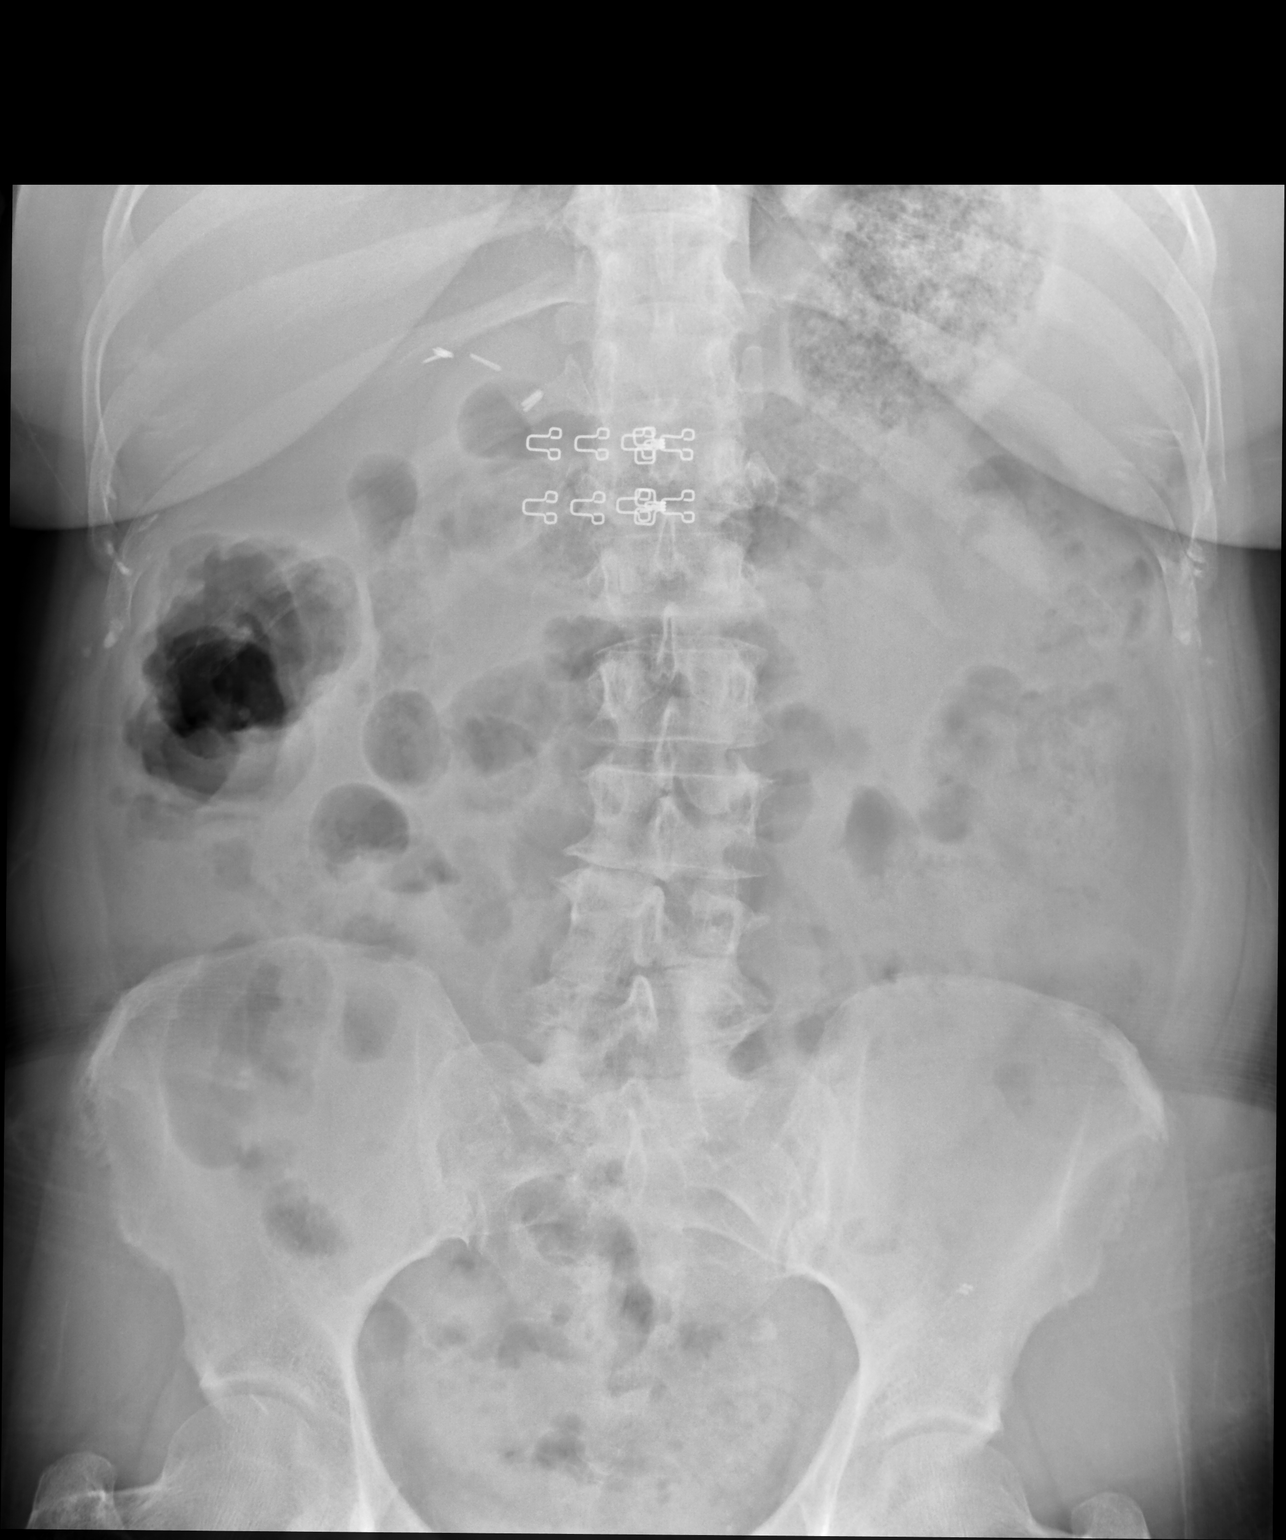

[1 of 1 positions shown; findings below may reference images not displayed]

FINDINGS: Surgical clips are identified in the right upper quadrant. Both
kidneys are significantly obscured by bowel contents. No renal
stones are identified. No right ureteral stones noted. A
calcification in the upper left pelvis measuring 6 mm is
nonspecific. Mild fecal loading in the colon. No other acute
abnormalities.
IMPRESSION: 1. Evaluation for renal stones is limited due to bowel contents but
none are seen.
2. A 6 mm calcification in the upper left pelvis is nonspecific but
unlikely to be a ureteral stone unless the patient has left-sided
symptoms.
3. Mild fecal loading.

## 2022-08-20 ENCOUNTER — Encounter: Payer: Self-pay | Admitting: Internal Medicine

## 2022-08-20 ENCOUNTER — Ambulatory Visit (INDEPENDENT_AMBULATORY_CARE_PROVIDER_SITE_OTHER): Payer: BC Managed Care – PPO | Admitting: Internal Medicine

## 2022-08-20 VITALS — BP 116/66 | HR 83 | Temp 98.4°F | Ht 59.0 in | Wt 156.2 lb

## 2022-08-20 DIAGNOSIS — E034 Atrophy of thyroid (acquired): Secondary | ICD-10-CM

## 2022-08-20 DIAGNOSIS — E1169 Type 2 diabetes mellitus with other specified complication: Secondary | ICD-10-CM

## 2022-08-20 DIAGNOSIS — I152 Hypertension secondary to endocrine disorders: Secondary | ICD-10-CM | POA: Diagnosis not present

## 2022-08-20 DIAGNOSIS — E785 Hyperlipidemia, unspecified: Secondary | ICD-10-CM

## 2022-08-20 DIAGNOSIS — Z1211 Encounter for screening for malignant neoplasm of colon: Secondary | ICD-10-CM

## 2022-08-20 DIAGNOSIS — Z1231 Encounter for screening mammogram for malignant neoplasm of breast: Secondary | ICD-10-CM

## 2022-08-20 DIAGNOSIS — Z23 Encounter for immunization: Secondary | ICD-10-CM | POA: Diagnosis not present

## 2022-08-20 DIAGNOSIS — E1159 Type 2 diabetes mellitus with other circulatory complications: Secondary | ICD-10-CM | POA: Diagnosis not present

## 2022-08-20 DIAGNOSIS — E669 Obesity, unspecified: Secondary | ICD-10-CM

## 2022-08-20 DIAGNOSIS — R5383 Other fatigue: Secondary | ICD-10-CM

## 2022-08-20 DIAGNOSIS — Z0000 Encounter for general adult medical examination without abnormal findings: Secondary | ICD-10-CM

## 2022-08-20 LAB — CBC WITH DIFFERENTIAL/PLATELET
Basophils Absolute: 0.1 10*3/uL (ref 0.0–0.1)
Basophils Relative: 1.1 % (ref 0.0–3.0)
Eosinophils Absolute: 0.3 10*3/uL (ref 0.0–0.7)
Eosinophils Relative: 5 % (ref 0.0–5.0)
HCT: 35.7 % — ABNORMAL LOW (ref 36.0–46.0)
Hemoglobin: 11.9 g/dL — ABNORMAL LOW (ref 12.0–15.0)
Lymphocytes Relative: 27.7 % (ref 12.0–46.0)
Lymphs Abs: 1.4 10*3/uL (ref 0.7–4.0)
MCHC: 33.4 g/dL (ref 30.0–36.0)
MCV: 90 fl (ref 78.0–100.0)
Monocytes Absolute: 0.3 10*3/uL (ref 0.1–1.0)
Monocytes Relative: 6.2 % (ref 3.0–12.0)
Neutro Abs: 3.1 10*3/uL (ref 1.4–7.7)
Neutrophils Relative %: 60 % (ref 43.0–77.0)
Platelets: 320 10*3/uL (ref 150.0–400.0)
RBC: 3.96 Mil/uL (ref 3.87–5.11)
RDW: 13.9 % (ref 11.5–15.5)
WBC: 5.2 10*3/uL (ref 4.0–10.5)

## 2022-08-20 LAB — COMPREHENSIVE METABOLIC PANEL
ALT: 11 U/L (ref 0–35)
AST: 13 U/L (ref 0–37)
Albumin: 3.9 g/dL (ref 3.5–5.2)
Alkaline Phosphatase: 73 U/L (ref 39–117)
BUN: 14 mg/dL (ref 6–23)
CO2: 28 mEq/L (ref 19–32)
Calcium: 9.5 mg/dL (ref 8.4–10.5)
Chloride: 103 mEq/L (ref 96–112)
Creatinine, Ser: 0.79 mg/dL (ref 0.40–1.20)
GFR: 80.44 mL/min (ref >60.00)
Glucose, Bld: 93 mg/dL (ref 70–99)
Potassium: 3.9 mEq/L (ref 3.5–5.1)
Sodium: 136 mEq/L (ref 135–145)
Total Bilirubin: 0.3 mg/dL (ref 0.2–1.2)
Total Protein: 6.7 g/dL (ref 6.0–8.3)

## 2022-08-20 LAB — TSH: TSH: 0.95 u[IU]/mL (ref 0.35–5.50)

## 2022-08-20 LAB — MICROALBUMIN / CREATININE URINE RATIO
Creatinine,U: 106.8 mg/dL
Microalb Creat Ratio: 2.4 mg/g (ref 0.0–30.0)
Microalb, Ur: 2.6 mg/dL — ABNORMAL HIGH (ref 0.0–1.9)

## 2022-08-20 LAB — LIPID PANEL
Cholesterol: 167 mg/dL (ref 0–200)
HDL: 51 mg/dL (ref >39.00)
LDL Cholesterol: 91 mg/dL (ref 0–99)
NonHDL: 115.62
Total CHOL/HDL Ratio: 3
Triglycerides: 124 mg/dL (ref 0.0–149.0)
VLDL: 24.8 mg/dL (ref 0.0–40.0)

## 2022-08-20 LAB — HEMOGLOBIN A1C: Hgb A1c MFr Bld: 5.8 % (ref 4.6–6.5)

## 2022-08-20 LAB — LDL CHOLESTEROL, DIRECT: Direct LDL: 107 mg/dL

## 2022-08-20 MED ORDER — PANTOPRAZOLE SODIUM 40 MG PO TBEC: 40.0000 mg | DELAYED_RELEASE_TABLET | Freq: Every day | ORAL | 1 refills | Status: DC

## 2022-08-20 MED ORDER — VENLAFAXINE HCL ER 75 MG PO CP24: 75.0000 mg | ORAL_CAPSULE | Freq: Every day | ORAL | 1 refills | Status: DC

## 2022-08-20 MED ORDER — LOSARTAN POTASSIUM 50 MG PO TABS: 50.0000 mg | ORAL_TABLET | Freq: Every day | ORAL | 1 refills | Status: DC

## 2022-08-20 NOTE — Patient Instructions (Addendum)
Try suspending metformin so you can advance ozempic dose when ready   30 minutes of cardio at least 5 days per week is your goal  2nd shingles dose is due after April 28 and before August 28  Referral to [facility] has been made.    HAVE FUN OUT WEST!!!!    [place] , [place]   has [place] awesome [place] called  [place].  Look it up!  You can go in on horseback or by halftrack (horseback is awesome!)

## 2022-08-20 NOTE — Progress Notes (Signed)
Patient ID: Natasha Pitts, female    DOB: 1960-12-06  Age: 62 y.o. MRN: [id number]  The patient is here for annual preventive examination and management of other chronic and acute problems.   The risk factors are reflected in the social history.   The roster of all physicians providing medical care to patient - is listed in the Snapshot section of the chart.   Activities of daily living:  The patient is 100% independent in all ADLs: dressing, toileting, feeding as well as independent mobility   Home safety : The patient has smoke detectors in the home. They wear seatbelts.  There are no unsecured firearms at home. There is no violence in the home.    There is no risks for hepatitis, STDs or HIV. There is no   history of blood transfusion. They have no travel history to infectious disease endemic areas of the world.   The patient has seen their dentist in the last six month. They have seen their eye doctor in the last year. The patinet  denies slight hearing difficulty with regard to whispered voices and some television programs.  They have deferred audiologic testing in the last year.  They do not  have excessive sun exposure. Discussed the need for sun protection: hats, long sleeves and use of sunscreen if there is significant sun exposure.    Diet: the importance of a healthy diet is discussed. They do have a healthy diet.   The benefits of regular aerobic exercise were discussed. The patient  exercises  3 to 5 days per week  for  60 minutes. Playing pickle ball on Sundays, walking    Depression screen: there are no signs or vegative symptoms of depression- irritability, change in appetite, anhedonia, sadness/tearfullness.   The following portions of the patient's history were reviewed and updated as appropriate: allergies, current medications, past family history, past medical history,  past surgical history, past social history  and problem list.   Visual acuity was not assessed per  patient preference since the patient has regular follow up with an  ophthalmologist. Hearing and body mass index were assessed and reviewed.    During the course of the visit the patient was educated and counseled about appropriate screening and preventive services including : fall prevention , diabetes screening, nutrition counseling, colorectal cancer screening, and recommended immunizations.    Chief Complaint:  Obesity:  lost 15 lbs on ozmpic had to reduce dose to 0.5 mg du e to nausea but taking metformin as well    Neck and back pain controlled with cocktail of  MR<  gabapentin and motrin    Review of Symptoms  Patient denies headache, fevers, malaise, unintentional weight loss, skin rash, eye pain, sinus congestion and sinus pain, sore throat, dysphagia,  hemoptysis , cough, dyspnea, wheezing, chest pain, palpitations, orthopnea, edema, abdominal pain, nausea, melena, diarrhea, constipation, flank pain, dysuria, hematuria, urinary  Frequency, nocturia, numbness, tingling, seizures,  Focal weakness, Loss of consciousness,  Tremor, insomnia, depression, anxiety, and suicidal ideation.    Physical Exam:  BP 116/66   Pulse 83   Temp 98.4 F (36.9 C) (Oral)   Ht '4\' 11"'$  (1.499 m)   Wt 156 lb 3.2 oz (70.9 kg)   SpO2 97%   BMI 31.55 kg/m    Physical Exam  Assessment and Plan: Obesity, diabetes, and hypertension syndrome (HCC)  Hypothyroidism due to acquired atrophy of thyroid  Hyperlipidemia associated with type 2 diabetes mellitus ([place])  Other fatigue  Colon cancer screening  Encounter for screening mammogram for malignant neoplasm of breast  Encounter for preventive health examination    No follow-ups on file.  Crecencio Mc, MD

## 2022-08-21 NOTE — Assessment & Plan Note (Signed)

## 2022-08-21 NOTE — Assessment & Plan Note (Addendum)
LDL is at goal wih Crestor   Lab Results  Component Value Date   CHOL 167 08/20/2022   HDL 51.00 08/20/2022   LDLCALC 91 08/20/2022   LDLDIRECT 107.0 08/20/2022   TRIG 124.0 08/20/2022   CHOLHDL 3 08/20/2022

## 2022-08-21 NOTE — Assessment & Plan Note (Addendum)
Currently well-controlled on current medications .  hemoglobin A1c is at goal of less than 6.0 . Patient is reminded to schedule an annual eye exam and foot exam is normal today. Patient has a reduction in  microalbuminuria. Patient is tolerating Crestor  for CAD risk reduction and on ACE/ARB for renal protection and hypertension  .  Advised to suspend metformin and increase ozempci to 1 mg weekly   Lab Results  Component Value Date   HGBA1C 5.8 08/20/2022   Lab Results  Component Value Date   MICROALBUR 2.6 (H) 08/20/2022   MICROALBUR 4.1 (H) 07/17/2021

## 2022-08-21 NOTE — Assessment & Plan Note (Signed)
Thyroid function was overactive on  88 mcg daily; dose was reduced to 75 mcg 6 months ago and is now optimal  Lab Results  Component Value Date   TSH 0.95 08/20/2022

## 2022-08-23 ENCOUNTER — Other Ambulatory Visit: Payer: Self-pay | Admitting: Internal Medicine

## 2022-09-08 LAB — HM DIABETES EYE EXAM

## 2022-09-17 ENCOUNTER — Encounter: Payer: Self-pay | Admitting: Internal Medicine

## 2022-09-23 ENCOUNTER — Encounter: Payer: Self-pay | Admitting: Internal Medicine

## 2022-09-24 ENCOUNTER — Other Ambulatory Visit: Payer: Self-pay | Admitting: Internal Medicine

## 2022-09-24 DIAGNOSIS — Z87442 Personal history of urinary calculi: Secondary | ICD-10-CM

## 2022-09-24 MED ORDER — GABAPENTIN 100 MG PO CAPS: 200.0000 mg | ORAL_CAPSULE | Freq: Three times a day (TID) | ORAL | 3 refills | Status: DC

## 2022-09-24 MED ORDER — ROSUVASTATIN CALCIUM 10 MG PO TABS: 10.0000 mg | ORAL_TABLET | Freq: Every day | ORAL | 1 refills | Status: DC

## 2022-09-24 NOTE — Telephone Encounter (Signed)
Just to let you know that these orders have been placed.

## 2022-09-24 NOTE — Telephone Encounter (Signed)
Pt states that she is taking Ozempic 2 mg once weekly, that dose is not in current medication list. Is it okay to send?

## 2022-09-24 NOTE — Telephone Encounter (Signed)
I see where the CT chest was ordered to be scheduled at requested location but I do not see the KUB ordered.

## 2022-09-26 ENCOUNTER — Other Ambulatory Visit: Payer: Self-pay | Admitting: Internal Medicine

## 2022-09-28 ENCOUNTER — Other Ambulatory Visit: Payer: Self-pay | Admitting: Internal Medicine

## 2022-09-29 NOTE — Telephone Encounter (Signed)
It is the CT cardiac scoring and the KUB is the abdominal xray she ordered.

## 2022-10-10 ENCOUNTER — Telehealth: Payer: Self-pay | Admitting: Internal Medicine

## 2022-10-10 NOTE — Telephone Encounter (Signed)
Order printed, signed and faxed as requested.

## 2022-10-10 NOTE — Telephone Encounter (Signed)
New message    Please fax CT order to [facility]   Fax # [phone]

## 2022-10-16 NOTE — Telephone Encounter (Signed)
I don't see a chest CT ordered in the chart.

## 2022-10-17 ENCOUNTER — Other Ambulatory Visit: Payer: Self-pay | Admitting: Internal Medicine

## 2022-10-17 DIAGNOSIS — Z87898 Personal history of other specified conditions: Secondary | ICD-10-CM

## 2022-10-29 ENCOUNTER — Other Ambulatory Visit: Payer: Self-pay | Admitting: Internal Medicine

## 2022-11-20 DIAGNOSIS — Z87898 Personal history of other specified conditions: Secondary | ICD-10-CM

## 2022-11-21 NOTE — Assessment & Plan Note (Signed)
May 2024 CT scan noted no change to the 3 mm pulmonary nodule seen previously  and no other concerns. A repeat scan in one year is advised.

## 2022-12-24 ENCOUNTER — Other Ambulatory Visit: Payer: Self-pay | Admitting: Internal Medicine

## 2022-12-31 ENCOUNTER — Ambulatory Visit: Payer: BC Managed Care – PPO | Admitting: Dermatology

## 2023-01-06 ENCOUNTER — Other Ambulatory Visit: Payer: Self-pay

## 2023-01-06 MED ORDER — LOSARTAN POTASSIUM 50 MG PO TABS: 50.0000 mg | ORAL_TABLET | Freq: Every day | ORAL | 1 refills | Status: DC

## 2023-01-17 ENCOUNTER — Other Ambulatory Visit: Payer: Self-pay | Admitting: Internal Medicine

## 2023-01-21 ENCOUNTER — Encounter (INDEPENDENT_AMBULATORY_CARE_PROVIDER_SITE_OTHER): Payer: Self-pay

## 2023-02-18 ENCOUNTER — Ambulatory Visit: Payer: BC Managed Care – PPO | Admitting: Internal Medicine

## 2023-02-18 ENCOUNTER — Encounter: Payer: Self-pay | Admitting: Internal Medicine

## 2023-02-18 ENCOUNTER — Ambulatory Visit (INDEPENDENT_AMBULATORY_CARE_PROVIDER_SITE_OTHER): Payer: BC Managed Care – PPO

## 2023-02-18 VITALS — BP 100/70 | HR 80 | Temp 98.0°F | Ht 59.0 in | Wt 156.8 lb

## 2023-02-18 DIAGNOSIS — Z87442 Personal history of urinary calculi: Secondary | ICD-10-CM | POA: Diagnosis not present

## 2023-02-18 DIAGNOSIS — R911 Solitary pulmonary nodule: Secondary | ICD-10-CM

## 2023-02-18 DIAGNOSIS — E785 Hyperlipidemia, unspecified: Secondary | ICD-10-CM

## 2023-02-18 DIAGNOSIS — E669 Obesity, unspecified: Secondary | ICD-10-CM | POA: Diagnosis not present

## 2023-02-18 DIAGNOSIS — E119 Type 2 diabetes mellitus without complications: Secondary | ICD-10-CM

## 2023-02-18 DIAGNOSIS — I152 Hypertension secondary to endocrine disorders: Secondary | ICD-10-CM

## 2023-02-18 DIAGNOSIS — I679 Cerebrovascular disease, unspecified: Secondary | ICD-10-CM

## 2023-02-18 DIAGNOSIS — Z23 Encounter for immunization: Secondary | ICD-10-CM

## 2023-02-18 DIAGNOSIS — M5106 Intervertebral disc disorders with myelopathy, lumbar region: Secondary | ICD-10-CM

## 2023-02-18 DIAGNOSIS — Z1231 Encounter for screening mammogram for malignant neoplasm of breast: Secondary | ICD-10-CM

## 2023-02-18 DIAGNOSIS — Z7985 Long-term (current) use of injectable non-insulin antidiabetic drugs: Secondary | ICD-10-CM

## 2023-02-18 DIAGNOSIS — E1159 Type 2 diabetes mellitus with other circulatory complications: Secondary | ICD-10-CM | POA: Diagnosis not present

## 2023-02-18 DIAGNOSIS — E034 Atrophy of thyroid (acquired): Secondary | ICD-10-CM | POA: Diagnosis not present

## 2023-02-18 DIAGNOSIS — E1169 Type 2 diabetes mellitus with other specified complication: Secondary | ICD-10-CM

## 2023-02-18 LAB — COMPREHENSIVE METABOLIC PANEL
ALT: 15 U/L (ref 0–35)
AST: 16 U/L (ref 0–37)
Albumin: 4.1 g/dL (ref 3.5–5.2)
Alkaline Phosphatase: 81 U/L (ref 39–117)
BUN: 15 mg/dL (ref 6–23)
CO2: 30 mEq/L (ref 19–32)
Calcium: 9.6 mg/dL (ref 8.4–10.5)
Chloride: 107 mEq/L (ref 96–112)
Creatinine, Ser: 0.89 mg/dL (ref 0.40–1.20)
GFR: 69.47 mL/min (ref >60.00)
Glucose, Bld: 123 mg/dL — ABNORMAL HIGH (ref 70–99)
Potassium: 4 mEq/L (ref 3.5–5.1)
Sodium: 144 mEq/L (ref 135–145)
Total Bilirubin: 0.4 mg/dL (ref 0.2–1.2)
Total Protein: 7.1 g/dL (ref 6.0–8.3)

## 2023-02-18 LAB — LIPID PANEL
Cholesterol: 155 mg/dL (ref 0–200)
HDL: 51.8 mg/dL (ref >39.00)
LDL Cholesterol: 70 mg/dL (ref 0–99)
NonHDL: 103.49
Total CHOL/HDL Ratio: 3
Triglycerides: 166 mg/dL — ABNORMAL HIGH (ref 0.0–149.0)
VLDL: 33.2 mg/dL (ref 0.0–40.0)

## 2023-02-18 LAB — LDL CHOLESTEROL, DIRECT: Direct LDL: 101 mg/dL

## 2023-02-18 LAB — TSH: TSH: 3.87 u[IU]/mL (ref 0.35–5.50)

## 2023-02-18 LAB — HEMOGLOBIN A1C: Hgb A1c MFr Bld: 5.9 % (ref 4.6–6.5)

## 2023-02-18 MED ORDER — OZEMPIC (1 MG/DOSE) 4 MG/3ML ~~LOC~~ SOPN: PEN_INJECTOR | SUBCUTANEOUS | 2 refills | Status: DC

## 2023-02-18 MED ORDER — ROSUVASTATIN CALCIUM 10 MG PO TABS: 10.0000 mg | ORAL_TABLET | Freq: Every day | ORAL | 1 refills | Status: DC

## 2023-02-18 MED ORDER — METHOCARBAMOL 750 MG PO TABS: 750.0000 mg | ORAL_TABLET | Freq: Three times a day (TID) | ORAL | 1 refills | Status: DC

## 2023-02-18 MED ORDER — TRIAMCINOLONE ACETONIDE 0.1 % EX CREA: TOPICAL_CREAM | CUTANEOUS | 1 refills | Status: DC

## 2023-02-18 MED ORDER — METFORMIN HCL ER 500 MG PO TB24: 500.0000 mg | ORAL_TABLET | Freq: Two times a day (BID) | ORAL | 1 refills | Status: DC

## 2023-02-18 MED ORDER — GABAPENTIN 100 MG PO CAPS: ORAL_CAPSULE | ORAL | 1 refills | Status: DC

## 2023-02-18 MED ORDER — PANTOPRAZOLE SODIUM 40 MG PO TBEC: 40.0000 mg | DELAYED_RELEASE_TABLET | Freq: Every day | ORAL | 1 refills | Status: DC

## 2023-02-18 NOTE — Assessment & Plan Note (Addendum)
Thyroid function is WNL on  75 mcg  Lab Results  Component Value Date   TSH 3.87 02/18/2023

## 2023-02-18 NOTE — Assessment & Plan Note (Addendum)
She remains  well-controlled on current medications .  hemoglobin A1c is at goal of less than 6.0 . Patient is reminded to schedule an annual eye exam and foot exam is normal today. Patient has had a reduction in  microalbuminuria. Patient is tolerating Crestor  for CAD risk reduction and on ACE/ARB for renal protection and hypertension  .  Advised to continue ozempic 1 mg weekly   Lab Results  Component Value Date   HGBA1C 5.8 08/20/2022   Lab Results  Component Value Date   MICROALBUR 2.6 (H) 08/20/2022   MICROALBUR 4.1 (H) 07/17/2021

## 2023-02-18 NOTE — Assessment & Plan Note (Signed)
LDL is at goal wih Crestor nd LFTs are normal   Lab Results  Component Value Date   CHOL 155 02/18/2023   HDL 51.80 02/18/2023   LDLCALC 70 02/18/2023   LDLDIRECT 101.0 02/18/2023   TRIG 166.0 (H) 02/18/2023   CHOLHDL 3 02/18/2023   Lab Results  Component Value Date   ALT 15 02/18/2023   AST 16 02/18/2023   ALKPHOS 81 02/18/2023   BILITOT 0.4 02/18/2023

## 2023-02-18 NOTE — Assessment & Plan Note (Signed)
She has nunmbess of toes on left foot .  Continue use of MR

## 2023-02-18 NOTE — Progress Notes (Signed)
Maintinain wight loo  Subjective:  Patient ID: Natasha Pitts, female    DOB: June 26, 1960  Age: 62 y.o. MRN: [id number]  CC: The primary encounter diagnosis was Obesity, diabetes, and hypertension syndrome (HCC). Diagnoses of Hypothyroidism due to acquired atrophy of thyroid, Hyperlipidemia associated with type 2 diabetes mellitus (HCC), Lung nodule seen on imaging study, History of nephrolithiasis, Lumbar disc disorder with myelopathy, Long-term current use of injectable noninsulin antidiabetic medication, Breast cancer screening by mammogram, and Need for shingles vaccine were also pertinent to this visit.   HPI Takyia Czechowski presents for  Chief Complaint  Patient presents with   Medical Management of Chronic Issues    6 month follow up      2) h/o nephrolithiasis:  de to dehydration.  No repeat events.  Drinks > 60 ounces daily  of water  Last KUB April 2023.     3)  Lung nodule   unchanged by May 2024 CT.  Annual repeat discussed.  Non smoker  1) obesity diabetes and HTN:  She feels generally well, is exercising several times per week and checking blood sugars once daily at variable times.  BS have been under 120 fasting and < 150 post prandially.  Denies any recent hypoglyemic events.   playing pickle ball 2/week.  Taking ozempic 1 mg. Maintaining  her weight  loss.  DOES NOT WANT TO INCREASE DUE TO NAUSEA .  Hypertension: patient checks blood pressure twice weekly at home.  Readings have been for the most part <130/80 at rest . Patient is following a reduced salt diet most days and is taking medications as prescribed    4) Needs order for 3d mammogram [facility]   5) Cheilosis    using triamcinolone.    Advised toavoid use of steroids.       Outpatient Medications Prior to Visit  Medication Sig Dispense Refill   Acetaminophen 500 MG capsule Take by mouth.     albuterol (VENTOLIN HFA) 108 (90 Base) MCG/ACT inhaler Inhale into the lungs.     cholecalciferol  (VITAMIN D3) 25 MCG (1000 UNIT) tablet Take 1,000 Units by mouth daily.     hydrochlorothiazide (MICROZIDE) 12.5 MG capsule Take 1 capsule (12.5 mg total) by mouth daily. (Patient taking differently: Take 12.5 mg by mouth as needed.) 90 capsule 1   ibuprofen (ADVIL) 200 MG tablet Take 400 mg by mouth 2 (two) times daily.     levothyroxine (SYNTHROID) 75 MCG tablet Take 1 tablet (75 mcg total) by mouth daily. 90 tablet 3   losartan (COZAAR) 50 MG tablet Take 1 tablet (50 mg total) by mouth daily. 90 tablet 1   tretinoin (RETIN-A) 0.025 % cream SMARTSIG:sparingly Topical Every Night     venlafaxine XR (EFFEXOR-XR) 75 MG 24 hr capsule TAKE 1 CAPSULE DAILY 90 capsule 1   gabapentin (NEURONTIN) 100 MG capsule TAKE 1 CAPSULE BY MOUTH THREE TIMES A DAY 90 capsule 1   metFORMIN (GLUCOPHAGE-XR) 500 MG 24 hr tablet TAKE 1 TABLET BY MOUTH TWICE A DAY 180 tablet 1   methocarbamol (ROBAXIN) 750 MG tablet TAKE 1 TABLET BY MOUTH THREE TIMES A DAY 270 tablet 1   OZEMPIC, 1 MG/DOSE, 4 MG/3ML SOPN INJECT 1 MG UNDER THE SKIN ONCE A WEEK AS DIRECTED 3 mL 2   pantoprazole (PROTONIX) 40 MG tablet Take 1 tablet (40 mg total) by mouth daily. 90 tablet 1   rosuvastatin (CRESTOR) 10 MG tablet Take 1 tablet (10 mg total) by mouth  at bedtime. 90 tablet 1   triamcinolone cream (KENALOG) 0.1 % Apply 1 application topically twice daily 30 g 1   Fluticasone-Salmeterol (ADVAIR DISKUS) 250-50 MCG/DOSE AEPB Inhale 1 puff into the lungs 2 (two) times daily. 3 each 3   No facility-administered medications prior to visit.    Review of Systems;  Patient denies headache, fevers, malaise, unintentional weight loss, skin rash, eye pain, sinus congestion and sinus pain, sore throat, dysphagia,  hemoptysis , cough, dyspnea, wheezing, chest pain, palpitations, orthopnea, edema, abdominal pain, nausea, melena, diarrhea, constipation, flank pain, dysuria, hematuria, urinary  Frequency, nocturia, numbness, tingling, seizures,  Focal weakness,  Loss of consciousness,  Tremor, insomnia, depression, anxiety, and suicidal ideation.      Objective:  BP 100/70   Pulse 80   Temp 98 F (36.7 C) (Oral)   Ht 4\' 11"  (1.499 m)   Wt 156 lb 12.8 oz (71.1 kg)   SpO2 97%   BMI 31.67 kg/m   BP Readings from Last 3 Encounters:  02/18/23 100/70  08/20/22 116/66  02/19/22 118/72    Wt Readings from Last 3 Encounters:  02/18/23 156 lb 12.8 oz (71.1 kg)  08/20/22 156 lb 3.2 oz (70.9 kg)  02/19/22 150 lb (68 kg)    Physical Exam Vitals reviewed.  Constitutional:      General: She is not in acute distress.    Appearance: Normal appearance. She is normal weight. She is not ill-appearing, toxic-appearing or diaphoretic.  HENT:     Head: Normocephalic.  Eyes:     General: No scleral icterus.       Right eye: No discharge.        Left eye: No discharge.     Conjunctiva/sclera: Conjunctivae normal.  Cardiovascular:     Rate and Rhythm: Normal rate and regular rhythm.     Heart sounds: Normal heart sounds.  Pulmonary:     Effort: Pulmonary effort is normal. No respiratory distress.     Breath sounds: Normal breath sounds.  Musculoskeletal:        General: Normal range of motion.  Skin:    General: Skin is warm and dry.  Neurological:     General: No focal deficit present.     Mental Status: She is alert and oriented to person, place, and time. Mental status is at baseline.  Psychiatric:        Mood and Affect: Mood normal.        Behavior: Behavior normal.        Thought Content: Thought content normal.        Judgment: Judgment normal.    Lab Results  Component Value Date   HGBA1C 5.9 02/18/2023   HGBA1C 5.8 08/20/2022   HGBA1C 5.9 02/19/2022    Lab Results  Component Value Date   CREATININE 0.89 02/18/2023   CREATININE 0.79 08/20/2022   CREATININE 0.69 02/19/2022    Lab Results  Component Value Date   WBC 5.2 08/20/2022   HGB 11.9 (L) 08/20/2022   HCT 35.7 (L) 08/20/2022   PLT 320.0 08/20/2022   GLUCOSE  123 (H) 02/18/2023   CHOL 155 02/18/2023   TRIG 166.0 (H) 02/18/2023   HDL 51.80 02/18/2023   LDLDIRECT 101.0 02/18/2023   LDLCALC 70 02/18/2023   ALT 15 02/18/2023   AST 16 02/18/2023   NA 144 02/18/2023   K 4.0 02/18/2023   CL 107 02/18/2023   CREATININE 0.89 02/18/2023   BUN 15 02/18/2023   CO2 30 02/18/2023  TSH 3.87 02/18/2023   HGBA1C 5.9 02/18/2023   MICROALBUR 2.6 (H) 08/20/2022    No results found.  Assessment & Plan:  .Obesity, diabetes, and hypertension syndrome (HCC) Assessment & Plan: She remains  well-controlled on current medications .  hemoglobin A1c is at goal of less than 6.0 . Patient is reminded to schedule an annual eye exam and foot exam is normal today. Patient has had a reduction in  microalbuminuria. Patient is tolerating Crestor  for CAD risk reduction and on ACE/ARB for renal protection and hypertension  .  Advised to continue ozempic 1 mg weekly   Lab Results  Component Value Date   HGBA1C 5.8 08/20/2022   Lab Results  Component Value Date   MICROALBUR 2.6 (H) 08/20/2022   MICROALBUR 4.1 (H) 07/17/2021       Orders: -     Hemoglobin A1c -     Comprehensive metabolic panel  Hypothyroidism due to acquired atrophy of thyroid Assessment & Plan: Thyroid function is WNL on  75 mcg  Lab Results  Component Value Date   TSH 3.87 02/18/2023     Orders: -     TSH  Hyperlipidemia associated with type 2 diabetes mellitus (HCC) Assessment & Plan: LDL is at goal wih Crestor nd LFTs are normal   Lab Results  Component Value Date   CHOL 155 02/18/2023   HDL 51.80 02/18/2023   LDLCALC 70 02/18/2023   LDLDIRECT 101.0 02/18/2023   TRIG 166.0 (H) 02/18/2023   CHOLHDL 3 02/18/2023   Lab Results  Component Value Date   ALT 15 02/18/2023   AST 16 02/18/2023   ALKPHOS 81 02/18/2023   BILITOT 0.4 02/18/2023     Orders: -     Lipid panel -     LDL cholesterol, direct  Lung nodule seen on imaging study Assessment & Plan: CONTINUE  ANNUAL SCREENING UNTIL RADIOLOGY decided that the nodule is benign in a  non smoker .  She has an Unchanged 3 mm   subpleural right lung last imaging was by [facility]  Diagnostic  Imaging  Nov 20 2022   History of nephrolithiasis Assessment & Plan: She is maintaining excellent hydration .  Annual KUB ordered   Orders: -     DG Abd 1 View; Future  Lumbar disc disorder with myelopathy Assessment & Plan: She has nunmbess of toes on left foot .  Continue use of MR   Long-term current use of injectable noninsulin antidiabetic medication Assessment & Plan: Continue ozempic at 1 mg weekly dose   Breast cancer screening by mammogram -     Digital Screening Mammogram, Left and Right; Future  Need for shingles vaccine -     Varicella-zoster vaccine IM  Other orders -     Pantoprazole Sodium; Take 1 tablet (40 mg total) by mouth daily.  Dispense: 90 tablet; Refill: 1 -     Rosuvastatin Calcium; Take 1 tablet (10 mg total) by mouth at bedtime.  Dispense: 90 tablet; Refill: 1 -     Gabapentin; TAKE 1 CAPSULE BY MOUTH THREE TIMES A DAY  Dispense: 90 capsule; Refill: 1 -     Triamcinolone Acetonide; Apply 1 application topically twice daily  Dispense: 80 g; Refill: 1 -     Methocarbamol; Take 1 tablet (750 mg total) by mouth 3 (three) times daily.  Dispense: 270 tablet; Refill: 1 -     Ozempic (1 MG/DOSE); INJECT 1 MG UNDER THE SKIN ONCE A WEEK AS DIRECTED  Dispense: 3 mL; Refill: 2     I provided 30 minutes of face-to-face time during this encounter reviewing patient's last visit with me, patient's  most recent visit with urology ,  recent surgical and non surgical procedures, previous  labs and imaging studies, counseling on currently addressed issues,  and post visit ordering to diagnostics and therapeutics .   Follow-up: Return in about 6 months (around 08/21/2023) for follow up diabetes.   Sherlene Shams, MD

## 2023-02-18 NOTE — Patient Instructions (Addendum)
Consider these alternatives to your morning bagel for better nutritional  support    having a fritatta for breakfast (home made or frozen see below). Frittatas are similar to quiches without the crust   1) Natasha Pitts now makes a frozen breakfast frittata that can be microwaved in 2 minutes and is very low carb.  2) [employer] ! Makes a low carb spinach & egg white frittata     OR:   try a premixed protein drink called Premier Protein shake in the morning.  It is less $$$ and very low sugar.    160 cal  30 g protein  1 g sugar 50% calcium needs

## 2023-02-18 NOTE — Assessment & Plan Note (Signed)
Continue ozempic at 1 mg weekly dose

## 2023-02-18 NOTE — Assessment & Plan Note (Addendum)
CONTINUE ANNUAL SCREENING UNTIL RADIOLOGY decided that the nodule is benign in a  non smoker .  She has an Unchanged 3 mm   subpleural right lung last imaging was by [facility]  Imaging  Nov 20 2022

## 2023-02-18 NOTE — Assessment & Plan Note (Signed)
She is maintaining excellent hydration .  Annual KUB ordered

## 2023-02-19 ENCOUNTER — Other Ambulatory Visit (HOSPITAL_COMMUNITY): Payer: Self-pay

## 2023-02-19 ENCOUNTER — Other Ambulatory Visit: Payer: Self-pay | Admitting: Internal Medicine

## 2023-02-19 ENCOUNTER — Telehealth: Payer: Self-pay | Admitting: Pharmacy Technician

## 2023-02-19 NOTE — Telephone Encounter (Signed)
Pharmacy Patient Advocate Encounter   Received notification from CoverMyMeds that prior authorization for Ozempic (1 MG/DOSE) 4MG /3ML pen-injectors is required/requested.   Insurance verification completed.   The patient is insured through  [employer]  .   Per test claim: PA required; PA started via CoverMyMeds. KEY [id number] . Waiting for clinical questions to populate.

## 2023-02-19 NOTE — Telephone Encounter (Signed)
Pharmacy Patient Advocate Encounter   Received notification from CoverMyMeds that prior authorization for ozempic is required/requested.   Insurance verification completed.   The patient is insured through  [employer]  .   Per test claim: PA required; PA submitted to [employer] via CoverMyMeds Key/confirmation #/EOC [id number] Status is pending

## 2023-02-20 ENCOUNTER — Other Ambulatory Visit (HOSPITAL_COMMUNITY): Payer: Self-pay

## 2023-02-20 NOTE — Telephone Encounter (Signed)
Pt is aware.  

## 2023-02-20 NOTE — Telephone Encounter (Signed)
Pharmacy Patient Advocate Encounter  Received notification from  [employer]  that Prior Authorization for Ozempic has been APPROVED from 02/19/23 to 02/18/26   PA #/Case ID/Reference #: [id number]

## 2023-02-27 ENCOUNTER — Telehealth: Payer: Self-pay

## 2023-02-27 NOTE — Telephone Encounter (Signed)
-----   Message from Eulis Foster sent at 02/27/2023 10:58 AM EDT ----- Herby Abraham of abdomen is normal

## 2023-02-27 NOTE — Telephone Encounter (Signed)
LMTCB in regards to x ray results.  

## 2023-03-05 ENCOUNTER — Encounter: Payer: Self-pay | Admitting: Internal Medicine

## 2023-03-05 NOTE — Telephone Encounter (Signed)
PT requesting increase in Ozempic. Medication has been pended

## 2023-03-06 MED ORDER — SEMAGLUTIDE (2 MG/DOSE) 8 MG/3ML ~~LOC~~ SOPN: 2.0000 mg | PEN_INJECTOR | SUBCUTANEOUS | 1 refills | Status: DC

## 2023-03-12 LAB — HM MAMMOGRAPHY

## 2023-05-05 ENCOUNTER — Other Ambulatory Visit: Payer: Self-pay | Admitting: Internal Medicine

## 2023-05-12 ENCOUNTER — Other Ambulatory Visit: Payer: Self-pay

## 2023-05-12 MED ORDER — ROSUVASTATIN CALCIUM 10 MG PO TABS: 10.0000 mg | ORAL_TABLET | Freq: Every day | ORAL | 1 refills | Status: DC

## 2023-05-13 ENCOUNTER — Other Ambulatory Visit: Payer: Self-pay | Admitting: Internal Medicine

## 2023-05-13 DIAGNOSIS — E034 Atrophy of thyroid (acquired): Secondary | ICD-10-CM

## 2023-05-24 ENCOUNTER — Encounter: Payer: Self-pay | Admitting: Internal Medicine

## 2023-05-29 ENCOUNTER — Other Ambulatory Visit: Payer: Self-pay | Admitting: Internal Medicine

## 2023-06-01 NOTE — Telephone Encounter (Signed)
Isn't 2 mg the highest dose of Ozempic?

## 2023-06-02 MED ORDER — TIRZEPATIDE 10 MG/0.5ML ~~LOC~~ SOAJ: 10.0000 mg | SUBCUTANEOUS | 2 refills | Status: DC

## 2023-06-02 NOTE — Addendum Note (Signed)
Addended by: Sherlene Shams on: 06/02/2023 01:04 PM   Modules accepted: Orders

## 2023-06-02 NOTE — Telephone Encounter (Signed)
Patient is switching from ozempic to [facility] because she ahs reached maximal benefit on ozempic 2 mg dose  please initiate PA

## 2023-06-03 ENCOUNTER — Telehealth: Payer: Self-pay

## 2023-06-03 ENCOUNTER — Other Ambulatory Visit (HOSPITAL_COMMUNITY): Payer: Self-pay

## 2023-06-03 NOTE — Telephone Encounter (Signed)
Pharmacy Patient Advocate Encounter   Received notification from Patient Pharmacy that prior authorization for [facility] 10mg /0.6ml is required/requested.   Insurance verification completed.   The patient is insured through [facility] .   Per test claim: PA required; PA started via CoverMyMeds. KEY [id number] . Waiting for clinical questions to populate.

## 2023-06-03 NOTE — Telephone Encounter (Signed)
LMTCB. Need to let pt know that Natasha Pitts has been approved through 06/02/2026 and the copay is $30 for a one month supply.

## 2023-06-03 NOTE — Telephone Encounter (Signed)
Pharmacy Patient Advocate Encounter  Received notification from [facility] that Prior Authorization for Mounjaro 10MG /0.5ML auto-injectors has been APPROVED from 06/03/2023 to 06/02/2026. Ran test claim, Copay is $30.00 for a one month supply. This test claim was processed through [facility]- copay amounts may vary at other pharmacies due to pharmacy/plan contracts, or as the patient moves through the different stages of their insurance plan.   PA #/Case ID/Reference #: [id number]

## 2023-06-03 NOTE — Telephone Encounter (Signed)
 Clinical questions answered and PA submitted

## 2023-08-24 ENCOUNTER — Other Ambulatory Visit: Payer: Self-pay

## 2023-08-24 MED ORDER — TIRZEPATIDE 10 MG/0.5ML ~~LOC~~ SOAJ: 10.0000 mg | SUBCUTANEOUS | 2 refills | Status: DC

## 2023-08-25 DIAGNOSIS — I679 Cerebrovascular disease, unspecified: Secondary | ICD-10-CM | POA: Insufficient documentation

## 2023-08-26 ENCOUNTER — Ambulatory Visit (INDEPENDENT_AMBULATORY_CARE_PROVIDER_SITE_OTHER): Payer: BC Managed Care – PPO | Admitting: Internal Medicine

## 2023-08-26 ENCOUNTER — Encounter: Payer: Self-pay | Admitting: Internal Medicine

## 2023-08-26 VITALS — BP 130/78 | HR 60 | Ht 59.0 in | Wt 153.0 lb

## 2023-08-26 DIAGNOSIS — K5790 Diverticulosis of intestine, part unspecified, without perforation or abscess without bleeding: Secondary | ICD-10-CM

## 2023-08-26 DIAGNOSIS — E1159 Type 2 diabetes mellitus with other circulatory complications: Secondary | ICD-10-CM

## 2023-08-26 DIAGNOSIS — E669 Obesity, unspecified: Secondary | ICD-10-CM

## 2023-08-26 DIAGNOSIS — I152 Hypertension secondary to endocrine disorders: Secondary | ICD-10-CM | POA: Diagnosis not present

## 2023-08-26 DIAGNOSIS — I679 Cerebrovascular disease, unspecified: Secondary | ICD-10-CM

## 2023-08-26 DIAGNOSIS — E034 Atrophy of thyroid (acquired): Secondary | ICD-10-CM

## 2023-08-26 DIAGNOSIS — E1169 Type 2 diabetes mellitus with other specified complication: Secondary | ICD-10-CM

## 2023-08-26 DIAGNOSIS — Z7985 Long-term (current) use of injectable non-insulin antidiabetic drugs: Secondary | ICD-10-CM

## 2023-08-26 DIAGNOSIS — E785 Hyperlipidemia, unspecified: Secondary | ICD-10-CM

## 2023-08-26 DIAGNOSIS — R911 Solitary pulmonary nodule: Secondary | ICD-10-CM

## 2023-08-26 DIAGNOSIS — K648 Other hemorrhoids: Secondary | ICD-10-CM | POA: Insufficient documentation

## 2023-08-26 DIAGNOSIS — F411 Generalized anxiety disorder: Secondary | ICD-10-CM

## 2023-08-26 DIAGNOSIS — Z0000 Encounter for general adult medical examination without abnormal findings: Secondary | ICD-10-CM

## 2023-08-26 MED ORDER — TRIAMCINOLONE ACETONIDE 0.1 % EX CREA: TOPICAL_CREAM | CUTANEOUS | 1 refills | Status: DC

## 2023-08-26 MED ORDER — HYDROCHLOROTHIAZIDE 12.5 MG PO CAPS: 12.5000 mg | ORAL_CAPSULE | Freq: Every day | ORAL | 1 refills | Status: DC

## 2023-08-26 MED ORDER — ALPRAZOLAM 0.25 MG PO TABS: 0.2500 mg | ORAL_TABLET | Freq: Two times a day (BID) | ORAL | 0 refills | Status: DC | PRN

## 2023-08-26 MED ORDER — ROSUVASTATIN CALCIUM 20 MG PO TABS: 20.0000 mg | ORAL_TABLET | Freq: Every day | ORAL | 1 refills | Status: DC

## 2023-08-26 MED ORDER — ASPIRIN 81 MG PO TBEC
81.0000 mg | DELAYED_RELEASE_TABLET | Freq: Every day | ORAL | Status: AC
Start: 2023-08-26 — End: ?

## 2023-08-26 NOTE — Patient Instructions (Addendum)
 Lacunar strokes are caused by high blood pressure,  so   Gaol BP is 120/70 or less given your MRI findings  I agree with increasing the crestor for goal LDL of 70 or less   Agree with baby aspirin daily or every other day   Try the caramel premier protein shakes for lunch  I have refilled the alprazolam to help your  work stress .  If you find yourself needing to use it daily,  We should consider a safer daily alternative that is not habit forming.  (like buspirone )

## 2023-08-26 NOTE — Progress Notes (Unsigned)
 Subjective:  Patient ID: Natasha Pitts, female    DOB: [birth year]  Age: 63 y.o. MRN: [id number]  CC: The primary encounter diagnosis was Obesity, diabetes, and hypertension syndrome (HCC). Diagnoses of Hyperlipidemia associated with type 2 diabetes mellitus (HCC), Hypothyroidism due to acquired atrophy of thyroid, Cerebrovascular disease, Encounter for preventive health examination, Diverticulosis, Lung nodule seen on imaging study, Long-term current use of injectable noninsulin antidiabetic medication, and Generalized anxiety disorder were also pertinent to this visit.   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 Checking  blood sugars less than once daily at variable times, usually only if she feels she may be having a hypoglycemic event. .  BS have been under 130 fasting and < 150 post prandially.  Denies any recent hypoglyemic events.  Taking   medications as directed. Following a carbohydrate modified diet 6 days per week. Denies numbness, burning and tingling of extremities. Appetite is diminshed and she is losing weight with Mounjaro.  Denies adverse effects including abdominal pain and nausea.   Wants to continue medication.     Lab Results  Component Value Date   HGBA1C 5.6 08/26/2023   2) evidence of a lacunar infarct on recent MRI Brain ordered by ophthalmology . She has a remote history of poorly controlled hypertension   3) Anxiety:  she cites increasing frustration at work because of the poor work ethic and lack of critical thinking employed by the younger Education administrator.  She is requesting a low dose of alprazolam to use prn to avoid confrontations that may jeopardize her work   Outpatient  Medications Prior to Visit  Medication Sig Dispense Refill   Acetaminophen 500 MG capsule Take by mouth.     albuterol (VENTOLIN HFA) 108 (90 Base) MCG/ACT inhaler Inhale into the lungs.     cholecalciferol (VITAMIN D3) 25 MCG (1000 UNIT) tablet Take 1,000 Units by mouth daily.     ibuprofen (ADVIL) 200 MG tablet Take 400 mg by mouth 2 (two) times daily.     levothyroxine (SYNTHROID) 75 MCG tablet TAKE 1 TABLET DAILY 90 tablet 2   losartan (COZAAR) 50 MG tablet TAKE 1 TABLET DAILY 90 tablet 1   methocarbamol (ROBAXIN) 750 MG tablet Take 1 tablet (750 mg total) by mouth 3 (three) times daily. 270 tablet 1   tirzepatide (MOUNJARO) 10 MG/0.5ML Pen Inject 10 mg into the skin once a week. 2 mL 2   tretinoin (RETIN-A) 0.025 % cream SMARTSIG:sparingly Topical Every Night     venlafaxine XR (EFFEXOR-XR) 75 MG 24 hr capsule TAKE 1 CAPSULE BY MOUTH EVERY DAY 90 capsule 1   rosuvastatin (CRESTOR) 10 MG tablet Take 1 tablet (10 mg total) by mouth at bedtime. 90 tablet 1   triamcinolone cream (KENALOG) 0.1 % Apply 1 application topically twice daily 80 g 1   gabapentin (NEURONTIN) 100 MG capsule TAKE 1 CAPSULE BY MOUTH THREE TIMES A DAY (Patient not taking: Reported on 08/26/2023) 90 capsule 1   hydrochlorothiazide (MICROZIDE) 12.5 MG capsule TAKE 1 CAPSULE(12.5 MG) BY MOUTH DAILY (Patient not taking: Reported on 08/26/2023) 90 capsule 1   pantoprazole (PROTONIX) 40 MG tablet Take 1  tablet (40 mg total) by mouth daily. (Patient not taking: Reported on 08/26/2023) 90 tablet 1   No facility-administered medications prior to visit.    Review of Systems;  Patient denies headache, fevers, malaise, unintentional weight loss, skin rash, eye pain, sinus congestion and sinus pain, sore throat, dysphagia,  hemoptysis , cough, dyspnea, wheezing, chest pain, palpitations, orthopnea, edema, abdominal pain, nausea, melena, diarrhea, constipation, flank pain, dysuria, hematuria, urinary  Frequency, nocturia, numbness,  tingling, seizures,  Focal weakness, Loss of consciousness,  Tremor, insomnia, depression, anxiety, and suicidal ideation.      Objective:  BP 130/78   Pulse 60   Ht 4\' 11"  (1.499 m)   Wt 153 lb (69.4 kg)   SpO2 97%   BMI 30.90 kg/m   BP Readings from Last 3 Encounters:  08/26/23 130/78  02/18/23 100/70  08/20/22 116/66    Wt Readings from Last 3 Encounters:  08/26/23 153 lb (69.4 kg)  02/18/23 156 lb 12.8 oz (71.1 kg)  08/20/22 156 lb 3.2 oz (70.9 kg)    Physical Exam Vitals reviewed.  Constitutional:      General: She is not in acute distress.    Appearance: Normal appearance. She is normal weight. She is not ill-appearing, toxic-appearing or diaphoretic.  HENT:     Head: Normocephalic.  Eyes:     General: No scleral icterus.       Right eye: No discharge.        Left eye: No discharge.     Conjunctiva/sclera: Conjunctivae normal.  Cardiovascular:     Rate and Rhythm: Normal rate and regular rhythm.     Heart sounds: Normal heart sounds.  Pulmonary:     Effort: Pulmonary effort is normal. No respiratory distress.     Breath sounds: Normal breath sounds.  Musculoskeletal:        General: Normal range of motion.  Skin:    General: Skin is warm and dry.  Neurological:     General: No focal deficit present.     Mental Status: She is alert and oriented to person, place, and time. Mental status is at baseline.  Psychiatric:        Mood and Affect: Mood normal.        Behavior: Behavior normal.        Thought Content: Thought content normal.        Judgment: Judgment normal.     Lab Results  Component Value Date   HGBA1C 5.6 08/26/2023   HGBA1C 5.9 02/18/2023   HGBA1C 5.8 08/20/2022    Lab Results  Component Value Date   CREATININE 0.91 08/26/2023   CREATININE 0.89 02/18/2023   CREATININE 0.79 08/20/2022    Lab Results  Component Value Date   WBC 5.2 08/20/2022   HGB 11.9 (L) 08/20/2022   HCT 35.7 (L) 08/20/2022   PLT 320.0 08/20/2022   GLUCOSE  86 08/26/2023   CHOL 204 (H) 08/26/2023   TRIG 150.0 (H) 08/26/2023   HDL 48.70 08/26/2023   LDLDIRECT 134.0 08/26/2023   LDLCALC 125 (H) 08/26/2023   ALT 15 08/26/2023   AST 19 08/26/2023   NA 138 08/26/2023   K 3.8 08/26/2023   CL 105 08/26/2023   CREATININE 0.91 08/26/2023   BUN 15 08/26/2023   CO2 26 08/26/2023   TSH 19.71 (H) 08/26/2023   HGBA1C 5.6 08/26/2023   MICROALBUR 1.5 08/26/2023    No results found.  Assessment & Plan:  .Obesity, diabetes, and hypertension syndrome (HCC) Assessment &  Plan: She remains  well-controlled on current medications .  hemoglobin A1c is at goal of less than 6.0 . Patient is reminded to schedule an annual eye exam and foot exam is normal today. Patient has had a reduction in  microalbuminuria. Patient is tolerating Crestor  for CAD risk reduction and on ACE/ARB for renal protection and hypertension  .  Advised to continue Mounjaro 10 mg weekly   Lab Results  Component Value Date   HGBA1C 5.6 08/26/2023   Lab Results  Component Value Date   MICROALBUR 1.5 08/26/2023   MICROALBUR 2.6 (H) 08/20/2022       Orders: -     Microalbumin / creatinine urine ratio -     Hemoglobin A1c -     Comprehensive metabolic panel  Hyperlipidemia associated with type 2 diabetes mellitus (HCC) Assessment & Plan: LDL is  not at goal wih Crestor and LFTs are normal .  Will increase dose for goal LDL 70   Lab Results  Component Value Date   CHOL 204 (H) 08/26/2023   HDL 48.70 08/26/2023   LDLCALC 125 (H) 08/26/2023   LDLDIRECT 134.0 08/26/2023   TRIG 150.0 (H) 08/26/2023   CHOLHDL 4 08/26/2023   Lab Results  Component Value Date   ALT 15 08/26/2023   AST 19 08/26/2023   ALKPHOS 78 08/26/2023   BILITOT 0.3 08/26/2023     Orders: -     Lipid panel -     LDL cholesterol, direct  Hypothyroidism due to acquired atrophy of thyroid -     TSH  Cerebrovascular disease  Encounter for preventive health examination Assessment &  Plan: Colonscopu normal  April 2024 at [facility].  10 yr follow up advised   Diverticulosis  Lung nodule seen on imaging study -     CT CHEST W CONTRAST; Future  Long-term current use of injectable noninsulin antidiabetic medication Assessment & Plan: Continue mOUNJARO   Generalized anxiety disorder Assessment & Plan: She has not used alprazolam to help manage her frustration and anger  In several months,  Since changing jobs. HOWEVER SHE has requested a refill for same reasons.   Continue venlafaxine 75 mg daily    Other orders -     Aspirin; Take 1 tablet (81 mg total) by mouth daily. Swallow whole. -     Triamcinolone Acetonide; Apply 1 application topically twice daily  Dispense: 80 g; Refill: 1 -     hydroCHLOROthiazide; Take 1 capsule (12.5 mg total) by mouth daily.  Dispense: 90 capsule; Refill: 1 -     ALPRAZolam; Take 1 tablet (0.25 mg total) by mouth 2 (two) times daily as needed for anxiety.  Dispense: 20 tablet; Refill: 0 -     Rosuvastatin Calcium; Take 1 tablet (40 mg total) by mouth at bedtime.  Dispense: 90 tablet; Refill: 1     I spent 34 minutes on the day of this face to face encounter reviewing patient's  most recent visit with ophthalmology,  prior relevant surgical and non surgical procedures, recent  labs and  brain MRI , counseling on weight /hypertension/hyperlipidemia management,  reviewing the assessment and plan with patient, and post visit ordering and reviewing of  diagnostics and therapeutics with patient  .   Follow-up: Return in about 6 months (around 02/26/2024).   Sherlene Shams, MD

## 2023-08-26 NOTE — Assessment & Plan Note (Signed)
 Colonscopu normal  April 2024 at [facility].  10 yr follow up advised

## 2023-08-27 ENCOUNTER — Telehealth: Payer: Self-pay | Admitting: Internal Medicine

## 2023-08-27 ENCOUNTER — Encounter: Payer: Self-pay | Admitting: Internal Medicine

## 2023-08-27 LAB — COMPREHENSIVE METABOLIC PANEL
ALT: 15 U/L (ref 0–35)
AST: 19 U/L (ref 0–37)
Albumin: 4.2 g/dL (ref 3.5–5.2)
Alkaline Phosphatase: 78 U/L (ref 39–117)
BUN: 15 mg/dL (ref 6–23)
CO2: 26 meq/L (ref 19–32)
Calcium: 9.6 mg/dL (ref 8.4–10.5)
Chloride: 105 meq/L (ref 96–112)
Creatinine, Ser: 0.91 mg/dL (ref 0.40–1.20)
GFR: 67.4 mL/min (ref >60.00)
Glucose, Bld: 86 mg/dL (ref 70–99)
Potassium: 3.8 meq/L (ref 3.5–5.1)
Sodium: 138 meq/L (ref 135–145)
Total Bilirubin: 0.3 mg/dL (ref 0.2–1.2)
Total Protein: 7 g/dL (ref 6.0–8.3)

## 2023-08-27 LAB — LIPID PANEL
Cholesterol: 204 mg/dL — ABNORMAL HIGH (ref 0–200)
HDL: 48.7 mg/dL (ref >39.00)
LDL Cholesterol: 125 mg/dL — ABNORMAL HIGH (ref 0–99)
NonHDL: 155.09
Total CHOL/HDL Ratio: 4
Triglycerides: 150 mg/dL — ABNORMAL HIGH (ref 0.0–149.0)
VLDL: 30 mg/dL (ref 0.0–40.0)

## 2023-08-27 LAB — HEMOGLOBIN A1C: Hgb A1c MFr Bld: 5.6 % (ref 4.6–6.5)

## 2023-08-27 LAB — MICROALBUMIN / CREATININE URINE RATIO
Creatinine,U: 114.8 mg/dL
Microalb Creat Ratio: 12.8 mg/g (ref 0.0–30.0)
Microalb, Ur: 1.5 mg/dL (ref 0.0–1.9)

## 2023-08-27 LAB — TSH: TSH: 19.71 u[IU]/mL — ABNORMAL HIGH (ref 0.35–5.50)

## 2023-08-27 LAB — LDL CHOLESTEROL, DIRECT: Direct LDL: 134 mg/dL

## 2023-08-27 MED ORDER — ROSUVASTATIN CALCIUM 40 MG PO TABS
40.0000 mg | ORAL_TABLET | Freq: Every day | ORAL | 1 refills | Status: DC
Start: 2023-08-27 — End: 2024-03-02

## 2023-08-27 MED ORDER — ROSUVASTATIN CALCIUM 40 MG PO TABS: 40.0000 mg | ORAL_TABLET | Freq: Every day | ORAL | 1 refills | Status: DC

## 2023-08-27 NOTE — Telephone Encounter (Signed)
 Lft pt vm to call ofc to sch CT. thanks

## 2023-08-27 NOTE — Assessment & Plan Note (Signed)
 She has not used alprazolam to help manage her frustration and anger  In several months,  Since changing jobs. HOWEVER SHE has requested a refill for same reasons.   Continue venlafaxine 75 mg daily

## 2023-08-27 NOTE — Assessment & Plan Note (Signed)
 Continue mOUNJARO

## 2023-08-27 NOTE — Assessment & Plan Note (Signed)
 LDL is  not at goal wih Crestor and LFTs are normal .  Will increase dose for goal LDL 70   Lab Results  Component Value Date   CHOL 204 (H) 08/26/2023   HDL 48.70 08/26/2023   LDLCALC 125 (H) 08/26/2023   LDLDIRECT 134.0 08/26/2023   TRIG 150.0 (H) 08/26/2023   CHOLHDL 4 08/26/2023   Lab Results  Component Value Date   ALT 15 08/26/2023   AST 19 08/26/2023   ALKPHOS 78 08/26/2023   BILITOT 0.3 08/26/2023

## 2023-08-27 NOTE — Assessment & Plan Note (Signed)
 She remains  well-controlled on current medications .  hemoglobin A1c is at goal of less than 6.0 . Patient is reminded to schedule an annual eye exam and foot exam is normal today. Patient has had a reduction in  microalbuminuria. Patient is tolerating Crestor  for CAD risk reduction and on ACE/ARB for renal protection and hypertension  .  Advised to continue Mounjaro 10 mg weekly   Lab Results  Component Value Date   HGBA1C 5.6 08/26/2023   Lab Results  Component Value Date   MICROALBUR 1.5 08/26/2023   MICROALBUR 2.6 (H) 08/20/2022

## 2023-08-28 ENCOUNTER — Telehealth: Payer: Self-pay | Admitting: Internal Medicine

## 2023-08-28 NOTE — Telephone Encounter (Signed)
 Lft pt vm to call ofc to sch CT. thanks

## 2023-09-07 ENCOUNTER — Telehealth: Payer: Self-pay

## 2023-09-07 ENCOUNTER — Encounter: Payer: Self-pay | Admitting: Internal Medicine

## 2023-09-07 MED ORDER — OSELTAMIVIR PHOSPHATE 75 MG PO CAPS: 75.0000 mg | ORAL_CAPSULE | Freq: Two times a day (BID) | ORAL | 0 refills | Status: DC

## 2023-09-07 MED ORDER — HYDROCOD POLI-CHLORPHE POLI ER 10-8 MG/5ML PO SUER: 5.0000 mL | Freq: Two times a day (BID) | ORAL | 0 refills | Status: DC | PRN

## 2023-09-07 NOTE — Addendum Note (Signed)
 Addended by: Sherlene Shams on: 09/07/2023 05:35 PM   Modules accepted: Orders

## 2023-09-07 NOTE — Telephone Encounter (Signed)
 Copied from CRM [id number]. Topic: Clinical - Prescription Issue >> Sep 07, 2023 12:19 PM Eunice Blase wrote: Reason for CRM: Pt called tested positive for Flu A, would like Tameflu and cough medication called into [id number] [place], [place] - [address] AT [facility] [address] [place] [address] Phone: [phone] Fax: [phone]. Please call pt at [phone].

## 2023-09-07 NOTE — Telephone Encounter (Signed)
 Pt tested positive today at health at work at [facility]. Pt is wanting to know if a cough medication and tamiflu can be sent in.

## 2023-09-08 NOTE — Telephone Encounter (Signed)
 See other mychart message. Medications have been sent in and pt is aware.

## 2023-09-12 ENCOUNTER — Encounter: Payer: Self-pay | Admitting: Internal Medicine

## 2023-09-12 DIAGNOSIS — E034 Atrophy of thyroid (acquired): Secondary | ICD-10-CM

## 2023-09-14 MED ORDER — LEVOTHYROXINE SODIUM 75 MCG PO TABS: 75.0000 ug | ORAL_TABLET | Freq: Every day | ORAL | 1 refills | Status: DC

## 2023-09-14 NOTE — Telephone Encounter (Signed)
 I have refilled the Levothyroxine but pt is wanting to increase the Losartan to 100 mg due to having continued elevated bp readings. Average bp is 134/78.

## 2023-09-16 LAB — HM DIABETES EYE EXAM

## 2023-09-16 MED ORDER — LOSARTAN POTASSIUM 100 MG PO TABS: 100.0000 mg | ORAL_TABLET | Freq: Every day | ORAL | 1 refills | Status: DC

## 2023-10-14 ENCOUNTER — Encounter: Payer: Self-pay | Admitting: Internal Medicine

## 2023-10-14 DIAGNOSIS — Z87442 Personal history of urinary calculi: Secondary | ICD-10-CM

## 2023-10-14 DIAGNOSIS — Z87898 Personal history of other specified conditions: Secondary | ICD-10-CM

## 2023-10-15 ENCOUNTER — Other Ambulatory Visit: Payer: Self-pay | Admitting: Internal Medicine

## 2023-10-15 DIAGNOSIS — Z87898 Personal history of other specified conditions: Secondary | ICD-10-CM

## 2023-10-18 ENCOUNTER — Other Ambulatory Visit: Payer: Self-pay | Admitting: Internal Medicine

## 2023-10-30 ENCOUNTER — Other Ambulatory Visit: Payer: Self-pay | Admitting: Internal Medicine

## 2023-10-30 MED ORDER — ALPRAZOLAM 0.25 MG PO TABS: 0.2500 mg | ORAL_TABLET | Freq: Two times a day (BID) | ORAL | 0 refills | Status: DC | PRN

## 2023-11-10 ENCOUNTER — Ambulatory Visit (INDEPENDENT_AMBULATORY_CARE_PROVIDER_SITE_OTHER): Admitting: Family

## 2023-11-10 ENCOUNTER — Other Ambulatory Visit: Payer: Self-pay | Admitting: Internal Medicine

## 2023-11-10 ENCOUNTER — Encounter: Payer: Self-pay | Admitting: Family

## 2023-11-10 ENCOUNTER — Ambulatory Visit: Payer: Self-pay

## 2023-11-10 VITALS — BP 108/71 | HR 79 | Temp 97.9°F | Ht 59.0 in | Wt 147.6 lb

## 2023-11-10 DIAGNOSIS — R197 Diarrhea, unspecified: Secondary | ICD-10-CM | POA: Diagnosis not present

## 2023-11-10 MED ORDER — PROMETHAZINE HCL 12.5 MG PO TABS: 12.5000 mg | ORAL_TABLET | Freq: Three times a day (TID) | ORAL | 0 refills | Status: AC | PRN

## 2023-11-10 NOTE — Progress Notes (Signed)
 Assessment & Plan:  Diarrhea, unspecified type Assessment & Plan: Exposure to C. difficile.  Afebrile.  Nontoxic in appearance.Ordered C. difficile, GI pathogen panel.  Fortunately she has been able to drink water today without recurrence of vomiting or diarrhea.  Provided her with refill of Phenergan , which she has been on in the past.  ER precautions given.  Orders: -     C Difficile Quick Screen w PCR reflex; Future -     GI pathogen panel by PCR, stool; Future -     Promethazine  HCl; Take 1 tablet (12.5 mg total) by mouth every 8 (eight) hours as needed for nausea or vomiting.  Dispense: 20 tablet; Refill: 0     Return precautions given.   Risks, benefits, and alternatives of the medications and treatment plan prescribed today were discussed, and patient expressed understanding.   Education regarding symptom management and diagnosis given to patient on AVS either electronically or printed.  No follow-ups on file.  Bascom Bossier, FNP  Subjective:    Patient ID: Natasha Pitts, female    DOB: 1960-10-26, 63 y.o.   MRN: [id number]  CC: Natasha Pitts is a 63 y.o. female who presents today for an acute visit.    HPI: Complains vomiting, diarrhea x 3 days Nonbloody emesis and stool.  She has started to drink fluids such as sprite and powerade today  Endorses nausea and generalized abdominal cramping; describes as ' abdomen feels sore'.   Last episode of diarrhea and emesis was yesterday evening.   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   She was the [facility] this morning and received IVF with thiamine and zofran .     History of diverticulitis.  Colonoscopy 10/01/2022, no specimens collected, hemorrhoids, diverticulosis No recent travel, abx H/o migraine, hypothyroidism Allergies: Imitrex [sumatriptan base] and Latex Current Outpatient Medications on File Prior to Visit  Medication Sig Dispense Refill   Acetaminophen  500 MG capsule Take by  mouth.     albuterol  (VENTOLIN  HFA) 108 (90 Base) MCG/ACT inhaler Inhale into the lungs.     ALPRAZolam  (XANAX ) 0.25 MG tablet Take 1 tablet (0.25 mg total) by mouth 2 (two) times daily as needed for anxiety. 20 tablet 0   aspirin  EC 81 MG tablet Take 1 tablet (81 mg total) by mouth daily. Swallow whole.     cholecalciferol (VITAMIN D3) 25 MCG (1000 UNIT) tablet Take 1,000 Units by mouth daily.     hydrochlorothiazide  (MICROZIDE ) 12.5 MG capsule Take 1 capsule (12.5 mg total) by mouth daily. 90 capsule 1   ibuprofen (ADVIL) 200 MG tablet Take 400 mg by mouth 2 (two) times daily.     levothyroxine  (SYNTHROID ) 75 MCG tablet Take 1 tablet (75 mcg total) by mouth daily. 90 tablet 1   losartan  (COZAAR ) 100 MG tablet Take 1 tablet (100 mg total) by mouth daily. 90 tablet 1   methocarbamol  (ROBAXIN ) 750 MG tablet TAKE 1 TABLET(750 MG) BY MOUTH THREE TIMES DAILY 270 tablet 1   rosuvastatin  (CRESTOR ) 40 MG tablet Take 1 tablet (40 mg total) by mouth at bedtime. 90 tablet 1   tirzepatide (MOUNJARO) 10 MG/0.5ML Pen Inject 10 mg into the skin once a week. 2 mL 2   triamcinolone  cream (KENALOG ) 0.1 % Apply 1 application topically twice daily 80 g 1   venlafaxine  XR (EFFEXOR -XR) 75 MG 24 hr capsule TAKE 1 CAPSULE BY MOUTH EVERY DAY 90 capsule 1   chlorpheniramine-HYDROcodone  (TUSSIONEX) 10-8 MG/5ML  Take 5 mLs by mouth every 12 (twelve) hours as needed for cough. 140 mL 0   oseltamivir  (TAMIFLU ) 75 MG capsule Take 1 capsule (75 mg total) by mouth 2 (two) times daily. 10 capsule 0   tretinoin (RETIN-A) 0.025 % cream SMARTSIG:sparingly Topical Every Night (Patient not taking: Reported on 11/10/2023)     No current facility-administered medications on file prior to visit.    Review of Systems  Constitutional:  Negative for chills and fever.  Respiratory:  Negative for cough.   Cardiovascular:  Negative for chest pain and palpitations.  Gastrointestinal:  Positive for abdominal distention, abdominal pain,  diarrhea, nausea and vomiting. Negative for constipation.      Objective:    BP 108/71   Pulse 79   Temp 97.9 F (36.6 C) (Oral)   Ht 4\' 11"  (1.499 m)   Wt 147 lb 9.6 oz (67 kg)   SpO2 98%   BMI 29.81 kg/m   BP Readings from Last 3 Encounters:  11/10/23 108/71  08/26/23 130/78  02/18/23 100/70   Wt Readings from Last 3 Encounters:  11/10/23 147 lb 9.6 oz (67 kg)  08/26/23 153 lb (69.4 kg)  02/18/23 156 lb 12.8 oz (71.1 kg)    Physical Exam Vitals reviewed.  Constitutional:      Appearance: Normal appearance. She is well-developed.  Eyes:     Conjunctiva/sclera: Conjunctivae normal.  Cardiovascular:     Rate and Rhythm: Normal rate and regular rhythm.     Pulses: Normal pulses.     Heart sounds: Normal heart sounds.  Pulmonary:     Effort: Pulmonary effort is normal.     Breath sounds: Normal breath sounds. No wheezing, rhonchi or rales.  Abdominal:     General: Bowel sounds are normal. There is distension.     Palpations: Abdomen is soft. Abdomen is not rigid. There is no fluid wave or mass.     Tenderness: There is generalized abdominal tenderness. There is no right CVA tenderness, left CVA tenderness, guarding or rebound.     Comments: Generalized non exquisite tenderness over distended abdomen. No focal tenderness.   Skin:    General: Skin is warm and dry.  Neurological:     Mental Status: She is alert.  Psychiatric:        Speech: Speech normal.        Behavior: Behavior normal.        Thought Content: Thought content normal.

## 2023-11-10 NOTE — Patient Instructions (Addendum)
 Concern for cdiff versus viral gastroenteritis.   Provided phenergan   If abdominal pain worsens, you develop fever, or unable to stay hydrated as discussed, please to nearest emergency room  Please return stool studies to [facility] today.

## 2023-11-10 NOTE — Telephone Encounter (Signed)
 Chief Complaint: vomiting Symptoms: abdominal cramping, nausea, diarrhea Frequency: intermittent Pertinent Negatives: Patient denies fever, all other symptoms not documented in additional notes Disposition: [] ED /[] Urgent Care (no appt availability in office) / [x] Appointment(In office/virtual)/ []  [facility]/ [] Home Care/ [] Refused Recommended Disposition /[] [facility]/ []  Follow-up with PCP Additional Notes:  For a few days she has had nausea, vomiting and diarrhea that started Sunday night. Vomiting and diarrhea is most during the night, eases during the day but nausea always present. She is currently at [facility] [place], she is receiving a banana bag, it is halfway done. Her urine is amber, having mild constant lower abdominal pain. Diarrhea is "massive" amount. They are suggesting she be evaluated after iv. Sheryle Donning is requesting appointment with Dr. Madelon Scheuermann or other provider vs urgent care. Acute evaluation scheduled with alternate provider today. Patient disconnected call before call advise provided.    Copied from CRM [id number]. Topic: Clinical - Red Word Triage >> Nov 10, 2023 10:01 AM Shereese L wrote: [facility] that prompted transfer to Nurse Triage: patient currently at the IV Bar, Nausea, vomiting, cramping in abdomen, fatigue, lower abdominal pain, Reason for Disposition . [1] Constant abdominal pain AND [2] present > 2 hours  Protocols used: Vomiting-A-AH

## 2023-11-12 ENCOUNTER — Telehealth

## 2023-11-12 NOTE — Assessment & Plan Note (Signed)
 Exposure to C. difficile.  Afebrile.  Nontoxic in appearance.Ordered C. difficile, GI pathogen panel.  Fortunately she has been able to drink water today without recurrence of vomiting or diarrhea.  Provided her with refill of Phenergan , which she has been on in the past.  ER precautions given.

## 2023-11-17 ENCOUNTER — Encounter: Payer: Self-pay | Admitting: Internal Medicine

## 2023-11-17 ENCOUNTER — Telehealth: Payer: Self-pay

## 2023-11-17 DIAGNOSIS — Z09 Encounter for follow-up examination after completed treatment for conditions other than malignant neoplasm: Secondary | ICD-10-CM | POA: Insufficient documentation

## 2023-11-17 NOTE — Telephone Encounter (Signed)
 LMTCB

## 2023-11-17 NOTE — Telephone Encounter (Signed)
 Would you like for me to see if I can move someone tomorrow so she can be seen?

## 2023-11-17 NOTE — Telephone Encounter (Signed)
 Copied from CRM [id number]. Topic: General - Other >> Nov 17, 2023 10:56 AM Adonis Hoot wrote: Reason for CRM: Patient returned call to [facility]. I relayed message for her regarding hospital  f/u.Patient stated that that day wont work for her and would like to speak with Camilo Cella to possibly get scheduled for another day.

## 2023-11-17 NOTE — Telephone Encounter (Signed)
 Spoke to pt. Pt stated she should could only do Wednesday due to go back to work.

## 2023-11-17 NOTE — Transitions of Care (Post Inpatient/ED Visit) (Signed)
 11/17/2023  Name: Natasha Pitts MRN: [id number] DOB: [birth year]  Today's TOC FU Call Status: Today's TOC FU Call Status:: Successful TOC FU Call Completed TOC FU Call Complete Date: 11/17/23 Patient's Name and Date of Birth confirmed.  Transition Care Management Follow-up Telephone Call Date of Discharge: 11/16/23 Discharge Facility: Other [facility]) Name of Other (Non[facility]) Discharge Facility: [facility] Type of Discharge: Inpatient Admission Primary Inpatient Discharge Diagnosis:: abd pain Any questions or concerns?: No  Items Reviewed: Did you receive and understand the discharge instructions provided?: Yes Medications obtained,verified, and reconciled?: Yes (Medications Reviewed) Any new allergies since your discharge?: No Dietary orders reviewed?: Yes  Medications Reviewed Today: Medications Reviewed Today     Reviewed by Darrall Ellison, LPN (Licensed Practical Nurse) on 11/17/23 at [phone]  Med List Status: <None>   Medication Order Taking? Sig Documenting Provider Last Dose Status Informant  Acetaminophen  500 MG capsule [id number] No Take by mouth. [provider] Taking Active   albuterol  (VENTOLIN  HFA) 108 (90 Base) MCG/ACT inhaler [id number] No Inhale into the lungs. [provider] Taking Active   ALPRAZolam  (XANAX ) 0.25 MG tablet [id number] No Take 1 tablet (0.25 mg total) by mouth 2 (two) times daily as needed for anxiety. Jarold Merlin B, FNP Taking Active   aspirin  EC 81 MG tablet [id number] No Take 1 tablet (81 mg total) by mouth daily. Swallow whole. Thersia Flax, MD Taking Active   chlorpheniramine-HYDROcodone  (TUSSIONEX) 10-8 MG/5ML [id number]  Take 5 mLs by mouth every 12 (twelve) hours as needed for cough. Thersia Flax, MD  Active   cholecalciferol (VITAMIN D3) 25 MCG (1000 UNIT) tablet [id number] No Take 1,000 Units by mouth daily. [provider] Taking Active   hydrochlorothiazide  (MICROZIDE ) 12.5 MG capsule [id number] No  Take 1 capsule (12.5 mg total) by mouth daily. Thersia Flax, MD Taking Active   ibuprofen (ADVIL) 200 MG tablet [id number] No Take 400 mg by mouth 2 (two) times daily. [provider] Taking Active   levothyroxine  (SYNTHROID ) 75 MCG tablet [id number] No Take 1 tablet (75 mcg total) by mouth daily. Thersia Flax, MD Taking Active   losartan  (COZAAR ) 100 MG tablet [id number] No Take 1 tablet (100 mg total) by mouth daily. Thersia Flax, MD Taking Active   methocarbamol  (ROBAXIN ) 750 MG tablet [id number] No TAKE 1 TABLET(750 MG) BY MOUTH THREE TIMES DAILY Thersia Flax, MD Taking Active   oseltamivir  (TAMIFLU ) 75 MG capsule [id number]  Take 1 capsule (75 mg total) by mouth 2 (two) times daily. Thersia Flax, MD  Active   promethazine  (PHENERGAN ) 12.5 MG tablet [id number]  Take 1 tablet (12.5 mg total) by mouth every 8 (eight) hours as needed for nausea or vomiting. Calista Catching, FNP  Active   rosuvastatin  (CRESTOR ) 40 MG tablet [id number] No Take 1 tablet (40 mg total) by mouth at bedtime. Thersia Flax, MD Taking Active   tirzepatide [facility]) 10 MG/0.5ML Pen [id number] No Inject 10 mg into the skin once a week. Thersia Flax, MD Taking Active   tretinoin (RETIN-A) 0.025 % cream [id number] No SMARTSIG:sparingly Topical Every Night  Patient not taking: Reported on 11/10/2023   [provider] Not Taking Active   triamcinolone  cream (KENALOG ) 0.1 % [id number] No Apply 1 application topically twice daily Tullo, Teresa L, MD Taking Active   venlafaxine  XR (EFFEXOR -XR) 75 MG 24 hr capsule [id number] No TAKE 1 CAPSULE BY MOUTH EVERY DAY Tullo, Teresa L, MD Taking Active  Home Care and Equipment/Supplies: Were Home Health Services Ordered?: NA Any new equipment or medical supplies ordered?: NA  Functional Questionnaire: Do you need assistance with bathing/showering or dressing?: No Do you need assistance with meal preparation?: No Do you need assistance with  eating?: No Do you have difficulty maintaining continence: No Do you need assistance with getting out of bed/getting out of a chair/moving?: No Do you have difficulty managing or taking your medications?: No  Follow up appointments reviewed: PCP Follow-up appointment confirmed?: No (sent message to staff to schedule) Specialist Hospital Follow-up appointment confirmed?: Yes Date of Specialist follow-up appointment?: 11/24/23 Follow-Up Specialty Provider:: GI Do you need transportation to your follow-up appointment?: No Do you understand care options if your condition(s) worsen?: Yes-patient verbalized understanding    SIGNATURE Darrall Ellison, LPN [facility] Nurse Health Advisor Direct Dial [phone]

## 2023-11-17 NOTE — Telephone Encounter (Signed)
 Pt called back and stated that she could do the appt tomorrow. Pt has been scheduled.

## 2023-11-18 ENCOUNTER — Ambulatory Visit (INDEPENDENT_AMBULATORY_CARE_PROVIDER_SITE_OTHER): Admitting: Internal Medicine

## 2023-11-18 ENCOUNTER — Ambulatory Visit

## 2023-11-18 VITALS — BP 124/72 | HR 75 | Ht 59.0 in | Wt 153.2 lb

## 2023-11-18 DIAGNOSIS — E1169 Type 2 diabetes mellitus with other specified complication: Secondary | ICD-10-CM

## 2023-11-18 DIAGNOSIS — Z7985 Long-term (current) use of injectable non-insulin antidiabetic drugs: Secondary | ICD-10-CM

## 2023-11-18 DIAGNOSIS — E034 Atrophy of thyroid (acquired): Secondary | ICD-10-CM | POA: Diagnosis not present

## 2023-11-18 DIAGNOSIS — Z09 Encounter for follow-up examination after completed treatment for conditions other than malignant neoplasm: Secondary | ICD-10-CM

## 2023-11-18 DIAGNOSIS — R197 Diarrhea, unspecified: Secondary | ICD-10-CM

## 2023-11-18 DIAGNOSIS — I152 Hypertension secondary to endocrine disorders: Secondary | ICD-10-CM

## 2023-11-18 DIAGNOSIS — E876 Hypokalemia: Secondary | ICD-10-CM

## 2023-11-18 DIAGNOSIS — K56609 Unspecified intestinal obstruction, unspecified as to partial versus complete obstruction: Secondary | ICD-10-CM | POA: Diagnosis not present

## 2023-11-18 DIAGNOSIS — E669 Obesity, unspecified: Secondary | ICD-10-CM

## 2023-11-18 DIAGNOSIS — Z8719 Personal history of other diseases of the digestive system: Secondary | ICD-10-CM

## 2023-11-18 DIAGNOSIS — E1159 Type 2 diabetes mellitus with other circulatory complications: Secondary | ICD-10-CM

## 2023-11-18 MED ORDER — ALPRAZOLAM 0.25 MG PO TABS: 0.2500 mg | ORAL_TABLET | Freq: Every evening | ORAL | 5 refills | Status: DC | PRN

## 2023-11-18 MED ORDER — TIRZEPATIDE 5 MG/0.5ML ~~LOC~~ SOAJ: 5.0000 mg | SUBCUTANEOUS | 2 refills | Status: DC

## 2023-11-18 NOTE — Progress Notes (Unsigned)
 Subjective:  Patient ID: Natasha Pitts, female    DOB: 1960/07/31  Age: 63 y.o. MRN: [id number]  CC: There were no encounter diagnoses.   HPI Natasha Pitts presents for  Chief Complaint  Patient presents with   Hospitalization Follow-up   2 weeks prior to admission she developed bloating and distension ,  new onset constipation . Stopped mounjaro.  But woke up on Monday am with nearly projectile vomiting and diarrhea that lasted 24 hours  [facility],  but continued to have vomiting even after liquids .  Went to [employer].  She presented to the [facility]  on 5/22 complaining of abdominal pain with nausea/vomiting x4 days and admitted  with CT imaging concerning for gastric pneumatosis with small locules of extraluminal air which may be related to air tracking along mesenteric vessels versus microperforation. There was also mucosal thickening and enhancement of multiple loops of small bowel in the right hemiabdomen with proximal small bowel dilation. She was admitted to general surgery service, and on day of admission no surgical intervention was recommended. She was started on MMPC, NPO, IV zosyn, and fluids.   On 11/13/2023, she had repeat CT abdomen/pelvis with po contrast which revealed resolution of previously described hypoenhancement of the gastric fundus and showed no leak. Gastric pneumatosis had significantly improved. Continued to have diffusely dilated small bowel loops and some abdominal and pelvic free fluid. She was started on IV PPI, carafate, and H pylori antigen was sent (result pending at discharge). She remained NPO, on IVF, and zosyn.   On 11/14/2023, Zosyn was discontinued due to improved CT findings. She had persistent leukopenia, and a normal lactate. Continued no recommendation for surgical intervention. Slowly began to advance her diet with sips of clears.   On 5/25, she reported feeling generally. She was started on clear liquids. On 5/26, she  was advanced to regular diet and tolerated it well. She reports she is passing gas, and feeling much better. She has been ambulatory in the hallways, and voiding spontaneously. she was discharged on Monday May 26 a nd has  follow up in the surgery clinic with Dr Herminia Lope on 11/24/2023."  since discharge she has had frequent  flatus and 2 small stools. Still having  mild nausea after eating but has not vomited and denies abd pain.   Natasha Pitts Has not had mounjaro  in 2 weeks   Appetite small but worried about regaining weigh off of mounjaro ;  walking for exercise,  has not resumed pickleball   Hypothyroid: TSH was elevated in March but dose was not adjusted or rechecked during hospitalization.  Takes her medication with coffee every morning   Lab Results  Component Value Date   TSH 19.71 (H) 08/26/2023      Outpatient Medications Prior to Visit  Medication Sig Dispense Refill   Acetaminophen  500 MG capsule Take by mouth.     albuterol  (VENTOLIN  HFA) 108 (90 Base) MCG/ACT inhaler Inhale into the lungs.     ALPRAZolam  (XANAX ) 0.25 MG tablet Take 1 tablet (0.25 mg total) by mouth 2 (two) times daily as needed for anxiety. 20 tablet 0   aspirin  EC 81 MG tablet Take 1 tablet (81 mg total) by mouth daily. Swallow whole.     cholecalciferol (VITAMIN D3) 25 MCG (1000 UNIT) tablet Take 1,000 Units by mouth daily.     levothyroxine  (SYNTHROID ) 75 MCG tablet Take 1 tablet (75 mcg total) by mouth daily. 90 tablet 1  methocarbamol  (ROBAXIN ) 750 MG tablet TAKE 1 TABLET(750 MG) BY MOUTH THREE TIMES DAILY 270 tablet 1   pantoprazole  (PROTONIX ) 40 MG tablet Take 1 tablet by mouth daily.     promethazine  (PHENERGAN ) 12.5 MG tablet Take 1 tablet (12.5 mg total) by mouth every 8 (eight) hours as needed for nausea or vomiting. 20 tablet 0   rosuvastatin  (CRESTOR ) 40 MG tablet Take 1 tablet (40 mg total) by mouth at bedtime. 90 tablet 1   tirzepatide (MOUNJARO) 10 MG/0.5ML Pen Inject 10 mg into the skin once a week. 2 mL 2    tretinoin (RETIN-A) 0.025 % cream      triamcinolone  cream (KENALOG ) 0.1 % Apply 1 application topically twice daily 80 g 1   venlafaxine  XR (EFFEXOR -XR) 75 MG 24 hr capsule TAKE 1 CAPSULE BY MOUTH EVERY DAY 90 capsule 1   hydrochlorothiazide  (MICROZIDE ) 12.5 MG capsule Take 1 capsule (12.5 mg total) by mouth daily. (Patient not taking: Reported on 11/18/2023) 90 capsule 1   losartan  (COZAAR ) 100 MG tablet Take 1 tablet (100 mg total) by mouth daily. (Patient not taking: Reported on 11/18/2023) 90 tablet 1   chlorpheniramine-HYDROcodone  (TUSSIONEX) 10-8 MG/5ML Take 5 mLs by mouth every 12 (twelve) hours as needed for cough. (Patient not taking: Reported on 11/18/2023) 140 mL 0   ibuprofen (ADVIL) 200 MG tablet Take 400 mg by mouth 2 (two) times daily. (Patient not taking: Reported on 11/18/2023)     oseltamivir  (TAMIFLU ) 75 MG capsule Take 1 capsule (75 mg total) by mouth 2 (two) times daily. (Patient not taking: Reported on 11/18/2023) 10 capsule 0   No facility-administered medications prior to visit.    Review of Systems;  Patient denies headache, fevers, malaise, unintentional weight loss, skin rash, eye pain, sinus congestion and sinus pain, sore throat, dysphagia,  hemoptysis , cough, dyspnea, wheezing, chest pain, palpitations, orthopnea, edema, abdominal pain, nausea, melena, diarrhea, constipation, flank pain, dysuria, hematuria, urinary  Frequency, nocturia, numbness, tingling, seizures,  Focal weakness, Loss of consciousness,  Tremor, insomnia, depression, anxiety, and suicidal ideation.      Objective:  BP 124/72   Pulse 75   Ht 4\' 11"  (1.499 m)   Wt 153 lb 3.2 oz (69.5 kg)   SpO2 98%   BMI 30.94 kg/m   BP Readings from Last 3 Encounters:  11/18/23 124/72  11/10/23 108/71  08/26/23 130/78    Wt Readings from Last 3 Encounters:  11/18/23 153 lb 3.2 oz (69.5 kg)  11/10/23 147 lb 9.6 oz (67 kg)  08/26/23 153 lb (69.4 kg)    Physical Exam  Lab Results  Component Value  Date   HGBA1C 5.6 08/26/2023   HGBA1C 5.9 02/18/2023   HGBA1C 5.8 08/20/2022    Lab Results  Component Value Date   CREATININE 0.91 08/26/2023   CREATININE 0.89 02/18/2023   CREATININE 0.79 08/20/2022    Lab Results  Component Value Date   WBC 5.2 08/20/2022   HGB 11.9 (L) 08/20/2022   HCT 35.7 (L) 08/20/2022   PLT 320.0 08/20/2022   GLUCOSE 86 08/26/2023   CHOL 204 (H) 08/26/2023   TRIG 150.0 (H) 08/26/2023   HDL 48.70 08/26/2023   LDLDIRECT 134.0 08/26/2023   LDLCALC 125 (H) 08/26/2023   ALT 15 08/26/2023   AST 19 08/26/2023   NA 138 08/26/2023   K 3.8 08/26/2023   CL 105 08/26/2023   CREATININE 0.91 08/26/2023   BUN 15 08/26/2023   CO2 26 08/26/2023   TSH 19.71 (H)  08/26/2023   HGBA1C 5.6 08/26/2023   MICROALBUR 1.5 08/26/2023    No results found.  Assessment & Plan:  .There are no diagnoses linked to this encounter.   I spent 34 minutes on the day of this face to face encounter reviewing patient's  most recent visit with cardiology,  nephrology,  and neurology,  prior relevant surgical and non surgical procedures, recent  labs and imaging studies, counseling on weight management,  reviewing the assessment and plan with patient, and post visit ordering and reviewing of  diagnostics and therapeutics with patient  .   Follow-up: No follow-ups on file.   Thersia Flax, MD

## 2023-11-18 NOTE — Assessment & Plan Note (Signed)
 BP meds stopped during recent hospitlization  and BP remains normal.  Mounjaro stopped 2 weeks prior to  hosp due to abd bloating

## 2023-11-18 NOTE — Patient Instructions (Signed)
 I recommend starting a magnesium supplement  250 mg mg glycinate to help bowel peristalsis.   Your Thyroid  may still  be underactive  if you have been taking med with anything but water.  Rechecking today  Do not resume Mounjaro until thyroid ,  abd films have been resulted and unless appetite gets out of control

## 2023-11-19 ENCOUNTER — Ambulatory Visit: Payer: Self-pay | Admitting: Internal Medicine

## 2023-11-19 DIAGNOSIS — Z8719 Personal history of other diseases of the digestive system: Secondary | ICD-10-CM | POA: Insufficient documentation

## 2023-11-19 LAB — TSH: TSH: 11.03 u[IU]/mL — ABNORMAL HIGH (ref 0.35–5.50)

## 2023-11-19 LAB — BASIC METABOLIC PANEL WITH GFR
BUN: 11 mg/dL (ref 6–23)
CO2: 26 meq/L (ref 19–32)
Calcium: 9.3 mg/dL (ref 8.4–10.5)
Chloride: 104 meq/L (ref 96–112)
Creatinine, Ser: 0.78 mg/dL (ref 0.40–1.20)
GFR: 80.96 mL/min (ref >60.00)
Glucose, Bld: 110 mg/dL — ABNORMAL HIGH (ref 70–99)
Potassium: 3.6 meq/L (ref 3.5–5.1)
Sodium: 141 meq/L (ref 135–145)

## 2023-11-19 LAB — MAGNESIUM: Magnesium: 1.5 mg/dL (ref 1.5–2.5)

## 2023-11-19 MED ORDER — LEVOTHYROXINE SODIUM 88 MCG PO TABS: 88.0000 ug | ORAL_TABLET | Freq: Every day | ORAL | 3 refills | Status: DC

## 2023-11-19 NOTE — Assessment & Plan Note (Signed)
Patient is stable post discharge and has no new issues or questions about discharge plans at the visit today for hospital follow up. All labs , imaging studies and progress notes from admission were reviewed with patient today   

## 2023-11-19 NOTE — Assessment & Plan Note (Signed)
 Thyroid  function is underactve on 75 mcg.  Will increase to 88 mcg and recheck in 6 weeks   Lab Results  Component Value Date   TSH 11.03 (H) 11/18/2023

## 2023-11-19 NOTE — Assessment & Plan Note (Addendum)
 Mounjaro has been suspended due to recent SBO in the setting of constipation .  Follow up film has a normal bowel gas pattern based on my interpretation

## 2023-11-19 NOTE — Assessment & Plan Note (Signed)
 Secondary to severe constipation and SBO obstruction basd on subsequent hospitalization at [facility]

## 2023-11-19 NOTE — Assessment & Plan Note (Signed)
 Likely due to use of Mounjaro  , low magnesium, and underactive throid.  Continue magesium, mounjaro  suspension and increase levothyroxine  dose to 88 mcg

## 2023-11-25 ENCOUNTER — Other Ambulatory Visit

## 2023-12-05 ENCOUNTER — Encounter: Payer: Self-pay | Admitting: Internal Medicine

## 2023-12-08 MED ORDER — ALPRAZOLAM 0.25 MG PO TABS: 0.2500 mg | ORAL_TABLET | Freq: Every evening | ORAL | 5 refills | Status: DC | PRN

## 2023-12-11 ENCOUNTER — Encounter: Payer: Self-pay | Admitting: Internal Medicine

## 2023-12-11 DIAGNOSIS — Z8719 Personal history of other diseases of the digestive system: Secondary | ICD-10-CM

## 2023-12-14 ENCOUNTER — Encounter: Payer: Self-pay | Admitting: Internal Medicine

## 2023-12-14 ENCOUNTER — Other Ambulatory Visit: Payer: Self-pay | Admitting: Internal Medicine

## 2023-12-14 DIAGNOSIS — Z8719 Personal history of other diseases of the digestive system: Secondary | ICD-10-CM

## 2023-12-14 NOTE — Telephone Encounter (Signed)
Pended referral for your approval

## 2023-12-22 ENCOUNTER — Other Ambulatory Visit: Payer: Self-pay | Admitting: Internal Medicine

## 2023-12-23 ENCOUNTER — Encounter: Payer: Self-pay | Admitting: Internal Medicine

## 2023-12-30 NOTE — Telephone Encounter (Signed)
 Printed and placed in Dr. Marylynn folder to be signed.

## 2023-12-31 NOTE — Telephone Encounter (Signed)
 Faxed over to the number requested

## 2024-01-06 ENCOUNTER — Encounter: Payer: Self-pay | Admitting: Internal Medicine

## 2024-01-06 DIAGNOSIS — E034 Atrophy of thyroid (acquired): Secondary | ICD-10-CM

## 2024-01-13 ENCOUNTER — Encounter: Payer: Self-pay | Admitting: Internal Medicine

## 2024-02-28 ENCOUNTER — Other Ambulatory Visit: Payer: Self-pay | Admitting: Internal Medicine

## 2024-03-02 ENCOUNTER — Encounter: Payer: Self-pay | Admitting: Internal Medicine

## 2024-03-02 ENCOUNTER — Ambulatory Visit: Admitting: Internal Medicine

## 2024-03-02 VITALS — BP 100/66 | HR 71 | Ht 59.0 in | Wt 151.6 lb

## 2024-03-02 DIAGNOSIS — I152 Hypertension secondary to endocrine disorders: Secondary | ICD-10-CM | POA: Diagnosis not present

## 2024-03-02 DIAGNOSIS — Z23 Encounter for immunization: Secondary | ICD-10-CM

## 2024-03-02 DIAGNOSIS — E669 Obesity, unspecified: Secondary | ICD-10-CM | POA: Diagnosis not present

## 2024-03-02 DIAGNOSIS — E034 Atrophy of thyroid (acquired): Secondary | ICD-10-CM

## 2024-03-02 DIAGNOSIS — E785 Hyperlipidemia, unspecified: Secondary | ICD-10-CM

## 2024-03-02 DIAGNOSIS — Z7985 Long-term (current) use of injectable non-insulin antidiabetic drugs: Secondary | ICD-10-CM | POA: Diagnosis not present

## 2024-03-02 DIAGNOSIS — E1169 Type 2 diabetes mellitus with other specified complication: Secondary | ICD-10-CM

## 2024-03-02 DIAGNOSIS — E1159 Type 2 diabetes mellitus with other circulatory complications: Secondary | ICD-10-CM

## 2024-03-02 MED ORDER — VENLAFAXINE HCL ER 75 MG PO CP24: ORAL_CAPSULE | ORAL | 1 refills | Status: DC

## 2024-03-02 MED ORDER — ROSUVASTATIN CALCIUM 20 MG PO TABS: 20.0000 mg | ORAL_TABLET | Freq: Every day | ORAL | 1 refills | Status: AC

## 2024-03-02 MED ORDER — PREDNISONE 10 MG PO TABS: ORAL_TABLET | ORAL | 0 refills | Status: AC

## 2024-03-02 MED ORDER — LOSARTAN POTASSIUM 100 MG PO TABS: 100.0000 mg | ORAL_TABLET | Freq: Every day | ORAL | 1 refills | Status: DC

## 2024-03-02 MED ORDER — AZITHROMYCIN 500 MG PO TABS: 500.0000 mg | ORAL_TABLET | Freq: Every day | ORAL | 0 refills | Status: AC

## 2024-03-02 MED ORDER — METHOCARBAMOL 750 MG PO TABS: ORAL_TABLET | ORAL | 1 refills | Status: AC

## 2024-03-02 NOTE — Patient Instructions (Addendum)
 IF   YOUR THYROID  FUNCTION IS NORMAL TODAY ,  WE WILL INCREASE MOUNJARO  TO 7.5  WITH THIS REFILL   IF THYROID  IS TILL UNDERACTIVE,  CONTINUE 5 MG UNTIL THYROID  FUNCTION  IS NORMALIZED  (6 WEEKS)   TAKE THE PREDNISONE  AND AZITHROMYCIN  WITH YOU TO THE DR

## 2024-03-02 NOTE — Progress Notes (Unsigned)
 Subjective:  Patient ID: Natasha Pitts, female    DOB: [birth year]  Age: 63 y.o. MRN: [id number]  CC: There were no encounter diagnoses.   HPI Natasha Pitts presents for  Chief Complaint  Patient presents with   Medical Management of Chronic Issues    6 month follow up     1) Type DM/obesity:  has  resumed  mounjaro   last May  at 5 mg weekly.   Bowels are moving adaly or every other day.  Weight is down 2 lbs and is exercising 3 to5 days per week with pickleball.   BP is currently managed with 100 mg losartan .  No hydrochlorothiazide    Lab Results  Component Value Date   HGBA1C 5.6 08/26/2023     2) hypothyroid: taking 88 mcg since May ;s level returned underactive again.   Repeat tsh is needed   Lab Results  Component Value Date   TSH 11.03 (H) 11/18/2023   3) travelling to [place] end of month. Getting flu vaccine today   Outpatient Medications Prior to Visit  Medication Sig Dispense Refill   Acetaminophen  500 MG capsule Take by mouth.     albuterol  (VENTOLIN  HFA) 108 (90 Base) MCG/ACT inhaler Inhale into the lungs.     ALPRAZolam  (XANAX ) 0.25 MG tablet Take 1 tablet (0.25 mg total) by mouth at bedtime as needed for anxiety. 30 tablet 5   aspirin  EC 81 MG tablet Take 1 tablet (81 mg total) by mouth daily. Swallow whole.     cholecalciferol (VITAMIN D3) 25 MCG (1000 UNIT) tablet Take 1,000 Units by mouth daily.     hydrochlorothiazide  (MICROZIDE ) 12.5 MG capsule Take 1 capsule (12.5 mg total) by mouth daily. (Patient not taking: Reported on 11/18/2023) 90 capsule 1   levothyroxine  (SYNTHROID ) 88 MCG tablet Take 1 tablet (88 mcg total) by mouth daily. 90 tablet 3   losartan  (COZAAR ) 100 MG tablet Take 1 tablet (100 mg total) by mouth daily. (Patient not taking: Reported on 11/18/2023) 90 tablet 1   methocarbamol  (ROBAXIN ) 750 MG tablet TAKE 1 TABLET(750 MG) BY MOUTH THREE TIMES DAILY 270 tablet 1   promethazine  (PHENERGAN ) 12.5 MG tablet Take 1 tablet (12.5 mg total) by  mouth every 8 (eight) hours as needed for nausea or vomiting. 20 tablet 0   rosuvastatin  (CRESTOR ) 40 MG tablet Take 1 tablet (40 mg total) by mouth at bedtime. 90 tablet 1   tretinoin (RETIN-A) 0.025 % cream      triamcinolone  cream (KENALOG ) 0.1 % Apply 1 application topically twice daily 80 g 1   venlafaxine  XR (EFFEXOR -XR) 75 MG 24 hr capsule TAKE 1 CAPSULE BY MOUTH EVERY DAY 90 capsule 1   No facility-administered medications prior to visit.    Review of Systems;  Patient denies headache, fevers, malaise, unintentional weight loss, skin rash, eye pain, sinus congestion and sinus pain, sore throat, dysphagia,  hemoptysis , cough, dyspnea, wheezing, chest pain, palpitations, orthopnea, edema, abdominal pain, nausea, melena, diarrhea, constipation, flank pain, dysuria, hematuria, urinary  Frequency, nocturia, numbness, tingling, seizures,  Focal weakness, Loss of consciousness,  Tremor, insomnia, depression, anxiety, and suicidal ideation.      Objective:  There were no vitals taken for this visit.  BP Readings from Last 3 Encounters:  11/18/23 124/72  11/10/23 108/71  08/26/23 130/78    Wt Readings from Last 3 Encounters:  11/18/23 153 lb 3.2 oz (69.5 kg)  11/10/23 147 lb 9.6 oz (67 kg)  08/26/23 153 lb (69.4  kg)    Physical Exam  Lab Results  Component Value Date   HGBA1C 5.6 08/26/2023   HGBA1C 5.9 02/18/2023   HGBA1C 5.8 08/20/2022    Lab Results  Component Value Date   CREATININE 0.78 11/18/2023   CREATININE 0.91 08/26/2023   CREATININE 0.89 02/18/2023    Lab Results  Component Value Date   WBC 5.2 08/20/2022   HGB 11.9 (L) 08/20/2022   HCT 35.7 (L) 08/20/2022   PLT 320.0 08/20/2022   GLUCOSE 110 (H) 11/18/2023   CHOL 204 (H) 08/26/2023   TRIG 150.0 (H) 08/26/2023   HDL 48.70 08/26/2023   LDLDIRECT 134.0 08/26/2023   LDLCALC 125 (H) 08/26/2023   ALT 15 08/26/2023   AST 19 08/26/2023   NA 141 11/18/2023   K 3.6 11/18/2023   CL 104 11/18/2023    CREATININE 0.78 11/18/2023   BUN 11 11/18/2023   CO2 26 11/18/2023   TSH 11.03 (H) 11/18/2023   HGBA1C 5.6 08/26/2023   MICROALBUR 1.5 08/26/2023    No results found.  Assessment & Plan:  .There are no diagnoses linked to this encounter.   I spent 34 minutes on the day of this face to face encounter reviewing patient's  most recent visit with cardiology,  nephrology,  and neurology,  prior relevant surgical and non surgical procedures, recent  labs and imaging studies, counseling on weight management,  reviewing the assessment and plan with patient, and post visit ordering and reviewing of  diagnostics and therapeutics with patient  .   Follow-up: No follow-ups on file.   Natasha LITTIE Kettering, MD

## 2024-03-02 NOTE — Assessment & Plan Note (Signed)
 Mounjaro   was stopped 2 weeks prior to  hospitalization ofr SBO  due to abd bloating . Has not resumed.  Weight is stable

## 2024-03-03 ENCOUNTER — Ambulatory Visit: Payer: Self-pay | Admitting: Internal Medicine

## 2024-03-03 LAB — LIPID PANEL
Cholesterol: 254 mg/dL — ABNORMAL HIGH (ref 0–200)
HDL: 51.2 mg/dL (ref >39.00)
LDL Cholesterol: 156 mg/dL — ABNORMAL HIGH (ref 0–99)
NonHDL: 202.43
Total CHOL/HDL Ratio: 5
Triglycerides: 231 mg/dL — ABNORMAL HIGH (ref 0.0–149.0)
VLDL: 46.2 mg/dL — ABNORMAL HIGH (ref 0.0–40.0)

## 2024-03-03 LAB — COMPREHENSIVE METABOLIC PANEL WITH GFR
ALT: 18 U/L (ref 0–35)
AST: 19 U/L (ref 0–37)
Albumin: 4.5 g/dL (ref 3.5–5.2)
Alkaline Phosphatase: 70 U/L (ref 39–117)
BUN: 13 mg/dL (ref 6–23)
CO2: 28 meq/L (ref 19–32)
Calcium: 10 mg/dL (ref 8.4–10.5)
Chloride: 102 meq/L (ref 96–112)
Creatinine, Ser: 1 mg/dL (ref 0.40–1.20)
GFR: 59.97 mL/min — ABNORMAL LOW (ref >60.00)
Glucose, Bld: 106 mg/dL — ABNORMAL HIGH (ref 70–99)
Potassium: 4.3 meq/L (ref 3.5–5.1)
Sodium: 139 meq/L (ref 135–145)
Total Bilirubin: 0.4 mg/dL (ref 0.2–1.2)
Total Protein: 7 g/dL (ref 6.0–8.3)

## 2024-03-03 LAB — HEMOGLOBIN A1C: Hgb A1c MFr Bld: 6.2 % (ref 4.6–6.5)

## 2024-03-03 LAB — LDL CHOLESTEROL, DIRECT: Direct LDL: 172 mg/dL

## 2024-03-03 LAB — MAGNESIUM: Magnesium: 1.8 mg/dL (ref 1.5–2.5)

## 2024-03-03 LAB — TSH: TSH: 1.54 u[IU]/mL (ref 0.35–5.50)

## 2024-03-03 MED ORDER — TIRZEPATIDE 7.5 MG/0.5ML ~~LOC~~ SOAJ: 7.5000 mg | SUBCUTANEOUS | 2 refills | Status: DC

## 2024-03-03 MED ORDER — EZETIMIBE 10 MG PO TABS: 10.0000 mg | ORAL_TABLET | Freq: Every day | ORAL | 3 refills | Status: AC

## 2024-03-03 NOTE — Assessment & Plan Note (Signed)
 Thyroid  function is WNL on current 75 mcg daily dose.  No current changes needed.    Lab Results  Component Value Date   TSH 1.54 03/02/2024

## 2024-03-03 NOTE — Assessment & Plan Note (Addendum)
 A1c is at goal.  LDL is  not at goal wih Crestor  and LFTs are normal .  She did not tolerate 40 mg dose of Crestor . Adding Zetia    Lab Results  Component Value Date   HGBA1C 6.2 03/02/2024    Lab Results  Component Value Date   CHOL 254 (H) 03/02/2024   HDL 51.20 03/02/2024   LDLCALC 156 (H) 03/02/2024   LDLDIRECT 172.0 03/02/2024   TRIG 231.0 (H) 03/02/2024   CHOLHDL 5 03/02/2024   Lab Results  Component Value Date   ALT 18 03/02/2024   AST 19 03/02/2024   ALKPHOS 70 03/02/2024   BILITOT 0.4 03/02/2024

## 2024-03-03 NOTE — Assessment & Plan Note (Signed)
 Mounjaro  has been resumed at 5 mg dose without recurrent constipation . She would like to increase her dose to 7.5 mg weekly

## 2024-03-14 LAB — HM MAMMOGRAPHY

## 2024-03-15 ENCOUNTER — Encounter: Payer: Self-pay | Admitting: Internal Medicine

## 2024-03-30 ENCOUNTER — Other Ambulatory Visit: Payer: Self-pay | Admitting: Internal Medicine

## 2024-04-04 ENCOUNTER — Telehealth: Payer: Self-pay | Admitting: Internal Medicine

## 2024-04-04 ENCOUNTER — Telehealth: Payer: Self-pay

## 2024-04-04 MED ORDER — PANTOPRAZOLE SODIUM 40 MG PO TBEC: 40.0000 mg | DELAYED_RELEASE_TABLET | Freq: Every day | ORAL | 0 refills | Status: DC

## 2024-04-04 NOTE — Telephone Encounter (Signed)
Pt is aware that medication has been refilled.  °

## 2024-04-04 NOTE — Telephone Encounter (Signed)
 Pt is requesting a refill on Pantoprazole . Medication is not in current med list.   Last refilled: 11/16/2023 Last OV: 03/02/2024 Next OV: 08/25/2023

## 2024-04-04 NOTE — Telephone Encounter (Signed)
 Vaccine record has been faxed and pt is aware.

## 2024-04-04 NOTE — Telephone Encounter (Signed)
 Copied from CRM [id number]. Topic: Clinical - Medication Refill >> Apr 04, 2024 11:40 AM Robinson H wrote: Medication: pantoprazole  (PROTONIX ) 40 MG tablet  Has the patient contacted their pharmacy? No, was previously going through mail order  (Agent: If no, request that the patient contact the pharmacy for the refill. If patient does not wish to contact the pharmacy document the reason why and proceed with request.) (Agent: If yes, when and what did the pharmacy advise?)  This is the patient's preferred pharmacy:  [id number] [place], [place] - [address] AT [facility] OF [address] [place] [address] Phone: [phone] Fax: [phone]  Is this the correct pharmacy for this prescription? Yes If no, delete pharmacy and type the correct one.   Has the prescription been filled recently? No  Is the patient out of the medication? No  Has the patient been seen for an appointment in the last year OR does the patient have an upcoming appointment? Yes  Can we respond through MyChart? Yes  Agent: Please be advised that Rx refills may take up to 3 business days. We ask that you follow-up with your pharmacy.

## 2024-04-04 NOTE — Telephone Encounter (Signed)
 Copied from CRM [id number]. Topic: Medical Record Request - Other >> Apr 04, 2024 11:36 AM Robinson H wrote: Reason for CRM: Patient needs proof of her flu vaccine that she had this year sent to her at the fax number below, states she needs proof for work. Niels [phone] Fax [phone] Attn:[name]

## 2024-04-29 ENCOUNTER — Other Ambulatory Visit: Payer: Self-pay | Admitting: Internal Medicine

## 2024-05-25 ENCOUNTER — Encounter: Payer: Self-pay | Admitting: Internal Medicine

## 2024-05-26 MED ORDER — TIRZEPATIDE 7.5 MG/0.5ML ~~LOC~~ SOAJ: 7.5000 mg | SUBCUTANEOUS | 2 refills | Status: AC

## 2024-06-02 ENCOUNTER — Other Ambulatory Visit: Payer: Self-pay | Admitting: Internal Medicine

## 2024-06-03 ENCOUNTER — Other Ambulatory Visit: Payer: Self-pay | Admitting: Internal Medicine

## 2024-06-09 ENCOUNTER — Other Ambulatory Visit: Payer: Self-pay | Admitting: Internal Medicine

## 2024-07-02 ENCOUNTER — Other Ambulatory Visit: Payer: Self-pay | Admitting: Internal Medicine

## 2024-07-03 ENCOUNTER — Other Ambulatory Visit: Payer: Self-pay | Admitting: Internal Medicine

## 2024-07-04 NOTE — Telephone Encounter (Signed)
LMTCB. Need to find out if pt is still taking this medication.

## 2024-07-04 NOTE — Telephone Encounter (Signed)
 Refilled: 12/08/2023 Last OV: 03/02/24 Next OV: 08/24/2024  Controlled substance agreement: No, I have left a message for them to call back so we can get her in here to sign.

## 2024-07-04 NOTE — Telephone Encounter (Signed)
 LMTCB. Need to let pt know that we will need her to come by the office and sign a controlled substance agreement so we can refill her Alprazolam .

## 2024-07-05 NOTE — Telephone Encounter (Signed)
 Letter has been created and placed upfront

## 2024-07-06 ENCOUNTER — Telehealth: Payer: Self-pay | Admitting: Internal Medicine

## 2024-07-06 NOTE — Telephone Encounter (Signed)
 Non- opioid substance agreement has been scan into the patient chart under release of information.

## 2024-08-24 ENCOUNTER — Encounter: Admitting: Internal Medicine
